# Patient Record
Sex: Male | Born: 1975 | Race: White | Hispanic: No | Marital: Married | Smoking: Never smoker
Health system: Southern US, Community
[De-identification: ages and names within clinical notes are randomized; demographics above are authoritative.]

## PROBLEM LIST (undated history)

## (undated) DIAGNOSIS — M545 Low back pain, unspecified: Secondary | ICD-10-CM

## (undated) DIAGNOSIS — M2550 Pain in unspecified joint: Secondary | ICD-10-CM

## (undated) DIAGNOSIS — R002 Palpitations: Secondary | ICD-10-CM

## (undated) DIAGNOSIS — R519 Headache, unspecified: Secondary | ICD-10-CM

## (undated) DIAGNOSIS — I10 Essential (primary) hypertension: Secondary | ICD-10-CM

## (undated) DIAGNOSIS — L309 Dermatitis, unspecified: Secondary | ICD-10-CM

## (undated) DIAGNOSIS — K219 Gastro-esophageal reflux disease without esophagitis: Secondary | ICD-10-CM

## (undated) DIAGNOSIS — R748 Abnormal levels of other serum enzymes: Secondary | ICD-10-CM

## (undated) HISTORY — DX: Dermatitis, unspecified: L30.9

## (undated) HISTORY — DX: Essential (primary) hypertension: I10

## (undated) HISTORY — DX: Abnormal levels of other serum enzymes: R74.8

## (undated) HISTORY — DX: Pain in unspecified joint: M25.50

## (undated) HISTORY — PX: OTHER SURGICAL HISTORY: SHX169

## (undated) HISTORY — DX: Palpitations: R00.2

## (undated) HISTORY — DX: Gastro-esophageal reflux disease without esophagitis: K21.9

## (undated) HISTORY — DX: Low back pain, unspecified: M54.50

## (undated) HISTORY — DX: Headache, unspecified: R51.9

---

## 2006-10-30 ENCOUNTER — Ambulatory Visit: Payer: Self-pay | Admitting: Internal Medicine

## 2006-10-30 DIAGNOSIS — L259 Unspecified contact dermatitis, unspecified cause: Secondary | ICD-10-CM

## 2006-10-30 DIAGNOSIS — J45909 Unspecified asthma, uncomplicated: Secondary | ICD-10-CM | POA: Insufficient documentation

## 2006-12-17 ENCOUNTER — Ambulatory Visit: Payer: Self-pay | Admitting: Internal Medicine

## 2006-12-18 ENCOUNTER — Encounter (INDEPENDENT_AMBULATORY_CARE_PROVIDER_SITE_OTHER): Payer: Self-pay | Admitting: *Deleted

## 2007-06-23 ENCOUNTER — Telehealth: Payer: Self-pay | Admitting: Internal Medicine

## 2007-06-25 ENCOUNTER — Encounter (INDEPENDENT_AMBULATORY_CARE_PROVIDER_SITE_OTHER): Payer: Self-pay | Admitting: *Deleted

## 2007-07-02 ENCOUNTER — Ambulatory Visit: Payer: Self-pay | Admitting: Internal Medicine

## 2007-07-02 LAB — CONVERTED CEMR LAB: Blood Glucose, Fasting: 89 mg/dL

## 2007-07-06 ENCOUNTER — Telehealth (INDEPENDENT_AMBULATORY_CARE_PROVIDER_SITE_OTHER): Payer: Self-pay | Admitting: *Deleted

## 2007-07-08 ENCOUNTER — Encounter: Payer: Self-pay | Admitting: Internal Medicine

## 2009-04-04 ENCOUNTER — Ambulatory Visit: Payer: Self-pay | Admitting: Internal Medicine

## 2009-06-03 DIAGNOSIS — R748 Abnormal levels of other serum enzymes: Secondary | ICD-10-CM

## 2009-06-03 HISTORY — DX: Abnormal levels of other serum enzymes: R74.8

## 2009-06-13 ENCOUNTER — Ambulatory Visit: Payer: Self-pay | Admitting: Internal Medicine

## 2009-06-13 LAB — CONVERTED CEMR LAB
ALT: 103 units/L — ABNORMAL HIGH (ref 0–53)
BUN: 14 mg/dL (ref 6–23)
Basophils Relative: 1 % (ref 0.0–3.0)
CO2: 29 meq/L (ref 19–32)
Calcium: 9.7 mg/dL (ref 8.4–10.5)
Creatinine, Ser: 1 mg/dL (ref 0.4–1.5)
Direct LDL: 144.6 mg/dL
GFR calc non Af Amer: 92.34 mL/min (ref >60)
Glucose, Bld: 93 mg/dL (ref 70–99)
HCT: 48.1 % (ref 39.0–52.0)
Hemoglobin: 16.9 g/dL (ref 13.0–17.0)
Lymphocytes Relative: 37.5 % (ref 12.0–46.0)
MCHC: 35.1 g/dL (ref 30.0–36.0)
MCV: 94.3 fL (ref 78.0–100.0)
Monocytes Absolute: 0.4 10*3/uL (ref 0.1–1.0)
Neutro Abs: 2.9 10*3/uL (ref 1.4–7.7)
Neutrophils Relative %: 51.2 % (ref 43.0–77.0)
Platelets: 271 10*3/uL (ref 150.0–400.0)
RDW: 13.4 % (ref 11.5–14.6)
Sodium: 141 meq/L (ref 135–145)
WBC: 5.8 10*3/uL (ref 4.5–10.5)

## 2009-06-15 ENCOUNTER — Telehealth: Payer: Self-pay | Admitting: Internal Medicine

## 2009-06-15 DIAGNOSIS — R7989 Other specified abnormal findings of blood chemistry: Secondary | ICD-10-CM | POA: Insufficient documentation

## 2009-06-26 ENCOUNTER — Ambulatory Visit: Payer: Self-pay | Admitting: Internal Medicine

## 2009-06-26 LAB — CONVERTED CEMR LAB
Albumin: 4.6 g/dL (ref 3.5–5.2)
Ferritin: 361.6 ng/mL — ABNORMAL HIGH (ref 22.0–322.0)
Total Bilirubin: 1 mg/dL (ref 0.3–1.2)
Total Protein: 7.2 g/dL (ref 6.0–8.3)

## 2009-07-03 LAB — CONVERTED CEMR LAB
HCV Ab: NEGATIVE
Hep B S Ab: NEGATIVE
Hepatitis B Surface Ag: NEGATIVE

## 2009-07-04 ENCOUNTER — Encounter: Payer: Self-pay | Admitting: Internal Medicine

## 2009-09-05 ENCOUNTER — Ambulatory Visit: Payer: Self-pay | Admitting: Internal Medicine

## 2009-09-05 DIAGNOSIS — R030 Elevated blood-pressure reading, without diagnosis of hypertension: Secondary | ICD-10-CM

## 2009-09-05 DIAGNOSIS — R002 Palpitations: Secondary | ICD-10-CM

## 2009-09-05 DIAGNOSIS — K219 Gastro-esophageal reflux disease without esophagitis: Secondary | ICD-10-CM | POA: Insufficient documentation

## 2009-09-05 DIAGNOSIS — L989 Disorder of the skin and subcutaneous tissue, unspecified: Secondary | ICD-10-CM | POA: Insufficient documentation

## 2009-09-10 ENCOUNTER — Telehealth: Payer: Self-pay | Admitting: Internal Medicine

## 2009-09-25 ENCOUNTER — Encounter: Payer: Self-pay | Admitting: Internal Medicine

## 2009-09-25 ENCOUNTER — Ambulatory Visit: Payer: Self-pay

## 2009-09-25 ENCOUNTER — Encounter (INDEPENDENT_AMBULATORY_CARE_PROVIDER_SITE_OTHER): Payer: Self-pay | Admitting: *Deleted

## 2009-09-25 ENCOUNTER — Ambulatory Visit: Payer: Self-pay | Admitting: Cardiology

## 2009-09-25 ENCOUNTER — Ambulatory Visit (HOSPITAL_COMMUNITY): Admission: RE | Admit: 2009-09-25 | Discharge: 2009-09-25 | Payer: Self-pay | Admitting: Internal Medicine

## 2010-01-08 ENCOUNTER — Ambulatory Visit: Payer: Self-pay | Admitting: Internal Medicine

## 2010-03-05 NOTE — Consult Note (Signed)
Summary: [facility]  [facility]   Imported By: Lanelle Bal 11/15/2009 14:21:17  _____________________________________________________________________  External Attachment:    Type:   Image     Comment:   External Document

## 2010-03-05 NOTE — Progress Notes (Signed)
Summary: heart beat  Phone Note Call from Patient   Caller: Patient Summary of Call: Pt called and states that he feels that his heart beat is skipping a beat. He said he discussed this with Dr. Drue Novel and Dr.Paz told him to call if he noticied the symptoms more often. He said they are happening randomly more at night. He is feeling very tired. Saturday night he felt dizzy but that was the only time that happened.  Please advise. Army Fossa CMA  September 10, 2009 11:02 AM   Follow-up for Phone Call        ER if symptoms severe Please schedule  -Holter monitor, 48 hours with diary -echocardiogram -office visit after tests Follow-up by: [facility] E. Paz MD,  September 10, 2009 1:30 PM  Additional Follow-up for Phone Call Additional follow up Details #1::        I spoke with pt he is aware to go to ER if symptoms severe. Put in referrals for echo and holter monitor. Army Fossa CMA  September 10, 2009 1:37 PM

## 2010-03-05 NOTE — Assessment & Plan Note (Signed)
Summary: CPX AND FASTING LABS///SPH   Vital Signs:  Patient profile:   35 year old male Height:      72.14 inches Weight:      279 pounds Pulse rate:   84 / minute BP sitting:   144 / 102  Vitals Entered By: Shary Decamp (Jun 13, 2009 8:05 AM) CC: cpx, fasting, concerned about wt   History of Present Illness: CPX concern about his weight   Preventive Screening-Counseling & Management  Caffeine-Diet-Exercise     Caffeine use/day: 2     Does Patient Exercise: no  Current Medications (verified): 1)  Ventolin Hfa 108 (90 Base) Mcg/act  Aers (Albuterol Sulfate) .... Prn 2)  Betamethasone Dipropionate 0.05 %  Crea (Betamethasone Dipropionate) .... Apply B.i.d. P.r.n. Two Hands  Allergies (verified): 1)  ! Pcn 2)  ! Jonne Ply  Past History:  Past Medical History: Reviewed history from 10/30/2006 and no changes required. Asthma  Past Surgical History: Reviewed history from 10/30/2006 and no changes required. bx of lump on inner thigh - dx scar tissue  Family History: Reviewed history from 10/30/2006 and no changes required. Father: living Mother: living   HTN-- M DM: MGF MI-- no colon ca-- no prostae ca--no     Social History: Reviewed history from 04/04/2009 and no changes required. 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 Resp:  Denies cough and wheezing; hardly ever has asthma symptoms , sometimes w/ cat exposure or a cold . GI:  Denies bloody stools, nausea, and vomiting. GU:  Denies dysuria, hematuria, and urinary hesitancy. Psych:  Denies anxiety and depression.  Physical Exam  General:  alert, well-developed, and overweight-appearing.   Neck:  no masses, no thyromegaly,  and normal carotid upstroke.   Lungs:  normal respiratory effort, no intercostal retractions, no accessory muscle use, and normal breath sounds.   Heart:  normal rate, regular rhythm, no murmur, and no gallop.   Abdomen:  soft, non-tender, no distention, and no masses.   Extremities:  no pretibial edema bilaterally  Psych:  Cognition and judgment appear intact. Alert and cooperative with normal attention span and concentration.  not anxious appearing and not depressed appearing.     Impression & Recommendations:  Problem # 1:  ROUTINE GENERAL MEDICAL EXAM@HEALTH  CARE FACL (ICD-V70.0) TD 2002 never had a Cscope  sees dentist routinely  patient is concerned about his weight and I agree with his concerns, BMI is approximately 38. He will not have time to exercise in the near future due to his long work hours he will have to concentrate in eating  healthier, portion control.  This was discussed with the patient, printed material provided. Declined  to see a nutritionist.   slight increased BP today, see instructions   Orders: Venipuncture (56387) TLB-BMP (Basic Metabolic Panel-BMET) (80048-METABOL) TLB-CBC Platelet - w/Differential (85025-CBCD) TLB-Lipid Panel (80061-LIPID) TLB-TSH (Thyroid Stimulating Hormone) (84443-TSH) TLB-ALT (SGPT) (84460-ALT) TLB-AST (SGOT) (84450-SGOT)  Problem # 2:  ECZEMA (ICD-692.9)  ongoing issue with hand eczema, status post dermatology evaluation, they couldn't help likes an  allergist referral---- Done His updated medication list for this problem includes:    Betamethasone Dipropionate 0.05 % Crea (Betamethasone dipropionate) .Marland Kitchen... Apply b.i.d.  p.r.n. two hands  Orders: Allergy Referral  (Allergy)  Complete Medication List: 1)  Ventolin Hfa 108 (90 Base) Mcg/act Aers (Albuterol sulfate) .... Prn 2)  Betamethasone Dipropionate 0.05 % Crea (Betamethasone dipropionate) .... Apply b.i.d. p.r.n. two hands  Patient Instructions: 1)  Check your blood  pressure 2 or 3 times a week. If it is more than 140/85 consistently,please let us know  2)  Please schedule a follow-up appointment in 1 year.    Preventive Care Screening  Prior Values:    Last Tetanus Booster:  Historical (02/04/2000)    Risk Factors:  Tobacco use:  never Passive smoke exposure:  no Drug use:  no Caffeine use:  2 drinks per day Alcohol use:  yes    Comments:  1x/wk Exercise:  no

## 2010-03-05 NOTE — Assessment & Plan Note (Signed)
Summary: 4 MONTH --PH   Vital Signs:  Patient profile:   35 year old male Height:      72 inches Weight:      290.13 pounds BMI:     39.49 Pulse rate:   82 / minute Pulse rhythm:   regular BP sitting:   134 / 81  (left arm) Cuff size:   large  Vitals Entered By: Army Fossa CMA (January 08, 2010 8:03 AM) CC: 4 month f/u- fasting  Comments [facility]    History of Present Illness: followup from the last office visit, she had palpitations Over all feels better, symptoms are less frequent and less intense Today he described a "palpitations" as a skipping heartbeat. There has been no associated symptoms lately He has not been able to tell if there is a trigger such as stress, alcohol, medicines.  Current Medications (verified): 1)  Ventolin Hfa 108 (90 Base) Mcg/act  Aers (Albuterol Sulfate) .... Prn 2)  Betamethasone Dipropionate 0.05 %  Crea (Betamethasone Dipropionate) .... Apply B.i.d. P.r.n. Two Hands  Allergies (verified): 1)  ! Pcn 2)  ! Jonne Ply  Past History:  Past Medical History: Reviewed history from 09/05/2009 and no changes required. Asthma increased LFTs 06/2009, negative hepatitis serology GERD Palpitations  Past Surgical History: Reviewed history from 10/30/2006 and no changes required. bx of lump on inner thigh - dx scar tissue  Review of Systems       no chest pain no amb BPs  Physical Exam  General:  alert and well-developed.   Lungs:  normal respiratory effort, no intercostal retractions, no accessory muscle use, and normal breath sounds.   Heart:  normal rate, regular rhythm, no murmur, and no gallop.     Impression & Recommendations:  Problem # 1:  PALPITATIONS (ICD-785.1) we discussed the results of the holter and echo. at the time he had the holter on him,  he had multiple episodes of palpitations ----> holter showed no arrythmias  at this point there is no evidence of any tachyarrhythmia. I recommend observation. Will call if  symptoms increase or are associated with red flags  Will consider cardiology referral and/or workup for pheochromocytoma.  Problem # 2:  ELEVATED BP READING WITHOUT DX HYPERTENSION (ICD-796.2) BP today is satisfactory. BP today: 134/81 Prior BP: 140/80 (09/05/2009)  Labs Reviewed: Creat: 1.0 (06/13/2009) Chol: 203 (06/13/2009)   HDL: 38.10 (06/13/2009)   TG: 124.0 (06/13/2009)     Complete Medication List: 1)  Ventolin Hfa 108 (90 Base) Mcg/act Aers (Albuterol sulfate) .... Prn 2)  Betamethasone Dipropionate 0.05 % Crea (Betamethasone dipropionate) .... Apply b.i.d. p.r.n. two hands  Patient Instructions: 1)  Please schedule a follow-up appointment in 6 months, fasting physical    Orders Added: 1)  Est. Patient Level III [16109]

## 2010-03-05 NOTE — Letter (Signed)
Summary: Outpatient Coinsurance Notice  Outpatient Coinsurance Notice   Imported By: Marylou Mccoy 10/05/2009 09:40:27  _____________________________________________________________________  External Attachment:    Type:   Image     Comment:   External Document

## 2010-03-05 NOTE — Progress Notes (Signed)
Summary: labs, abnormal LFTs    Phone Note Outgoing Call   Summary of Call: advise  patient, labs okay except for his LFTs that are mildly elevated  questions for the patient: History previous abnormal LFTs? How much alcohol or Tylenol does  he take? needs the following labs fasting: LFTs ( not  only AST ALT) anti HepBsAg anti HepBc HepBs Ag Hep C iron, ferritin Jacey Eckerson E. Nickolaus Bordelon MD  Jun 15, 2009 8:23 AM   Follow-up for Phone Call        discussed with pt Shary Decamp  Jun 15, 2009 11:04 AM   New Problems: OTHER ABNORMAL BLOOD CHEMISTRY (ICD-790.6)   New Problems: OTHER ABNORMAL BLOOD CHEMISTRY (ICD-790.6)  Appended Document: labs, abnormal LFTs   addendum: patient stated he does not drink but once a week or use tylenol

## 2010-03-05 NOTE — Assessment & Plan Note (Signed)
Summary: acid reflux-bp -eval mole/cbs   Vital Signs:  Patient profile:   35 year old male Weight:      284.25 pounds Pulse rate:   84 / minute Pulse rhythm:   regular BP sitting:   140 / 80  (left arm) Cuff size:   large  Vitals Entered By: Army Fossa CMA (September 05, 2009 2:42 PM) CC: Pt here to discuss issues. Comments - Discuss mole on temple. - Having Heartburn - Discuss BP   History of Present Illness: multiple issues has several skin lesions in the neck x years , like them removed Has a new skin lesion in the left temple  long history of GERD he describes as epigastric burning,some burping Symptoms increased lately Not taking any medication  feels like his heart skipped randomly, symptoms are mild and going on for many years. Last week the "skipping heartbeats" were more noticeable and lasted several hours  Ambulatory BPs in the 140s-150s/high 80s  ROS Denies nausea, vomiting, bloating stools No dysphagia or odynophagia Denies chest pain, shortness of breath or near fainting with skipping heartbeats No more stressed out than usual Caffeine intake at baseline  Current Medications (verified): 1)  Ventolin Hfa 108 (90 Base) Mcg/act  Aers (Albuterol Sulfate) .... Prn 2)  Betamethasone Dipropionate 0.05 %  Crea (Betamethasone Dipropionate) .... Apply B.i.d. P.r.n. Two Hands  Allergies: 1)  ! Pcn 2)  ! Asa  Past History:  Past Medical History: Asthma increased LFTs 06/2009, negative hepatitis serology GERD Palpitations  Social History: Reviewed history from 06/13/2009 and no changes required. 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   Lungs:  normal respiratory effort, no intercostal retractions, no accessory muscle use, and normal breath sounds.     Heart:  normal rate, regular rhythm, no murmur, and no gallop.   Abdomen:  soft, non-tender, no distention, no masses, no guarding, and no rigidity.   Skin:  several skin lesions consistent with skin tags in the back of the neck. A 3x2 mm lesion in the left temple, pink in color, nontender   Impression & Recommendations:  Problem # 1:  SKIN LESION (ICD-709.9)  patient desires excision of the back lesions The temple skin lesion is new   Dermatology referral  Orders: Dermatology Referral (Derma)  Problem # 2:  GERD (ICD-530.81)  recommend Prilosec over-the-counter Information provided regards GERD precautions  His updated medication list for this problem includes:    Prilosec Otc 20 Mg Tbec (Omeprazole magnesium) ..... One by mouth before breakfast  Problem # 3:  PALPITATIONS (ICD-785.1) see history of present illness No red  flag symptoms  EKG today shows sinus rhythm without ischemic changes, rate 69 plan: Observation, patient to call if chest pain, dizziness, diaphoresis.  Problem # 4:  OTHER ABNORMAL BLOOD CHEMISTRY (ICD-790.6) Will recheck LFTs on return to the office  Problem # 5:  ELEVATED BP READING WITHOUT DX HYPERTENSION (ICD-796.2) borderline elevation of BP in the ambulatory setting BP here today okay Will for now recommend lifestyle modification, low salt diet discussed with the patient  Orders: EKG w/ Interpretation (93000)  Complete Medication List: 1)  Ventolin Hfa 108 (90 Base) Mcg/act Aers (Albuterol sulfate) .... Prn 2)  Betamethasone Dipropionate 0.05 % Crea (Betamethasone dipropionate) .... Apply b.i.d. p.r.n. two hands 3)  Prilosec Otc 20 Mg  Tbec (Omeprazole magnesium) .... One by mouth before breakfast  Patient Instructions: 1)  Please schedule a follow-up appointment in 4 months .

## 2010-03-05 NOTE — Assessment & Plan Note (Signed)
Summary: ear infection/kdc   Vital Signs:  Patient profile:   35 year old male Height:      72.14 inches Weight:      284 pounds BMI:     38.51 Temp:     99.8 degrees F BP sitting:   120 / 80  Vitals Entered By: Shary Decamp (April 04, 2009 12:41 PM)  Nutrition Counseling: Patient's BMI is greater than 25 and therefore counseled on weight management options.  History of Present Illness: developed ear discomfort yesterday, left-sided  Allergies: 1)  ! Pcn 2)  ! Jonne Ply  Past History:  Past Medical History: Reviewed history from 10/30/2006 and no changes required. Asthma  Past Surgical History: Reviewed history from 10/30/2006 and no changes required. bx of lump on inner thigh - dx scar tissue  Social History: Single no kids  moved from [place] , 2008 tobacco-no  Review of Systems       denies fever at home, today at the office he has a low-grade temp no sinus congestion, no sore throat asthma is well controlled he use his gun during the weekend, he did use earplugs, denies tinnitus  Physical Exam  General:  alert and well-developed.   Head:  face symmetric, not tender to palpation in the maxillary sinuses Ears:  R ear normal and L ear normal.   Mouth:  no redness or discharge in the throat Lungs:  clear to auscultation bilaterally   Impression & Recommendations:  Problem # 1:  OTALGIA (ICD-388.70) otalgia a low-grade temp today, the exam is essentially normal early URI? Plan: Observation, he will call if he is no better in few days His updated medication list for this problem includes:    Doxy-caps 100 Mg Caps (Doxycycline hyclate) ..... One tab by mouth two times a day  Complete Medication List: 1)  Ventolin Hfa 108 (90 Base) Mcg/act Aers (Albuterol sulfate) .... Prn 2)  Betamethasone Dipropionate 0.05 % Crea (Betamethasone dipropionate) .... Apply b.i.d. p.r.n. two hands 3)  Doxy-caps 100 Mg Caps (Doxycycline hyclate) .... One tab by mouth two times a  day

## 2010-03-05 NOTE — Procedures (Signed)
Summary: summary report  summary report   Imported By: Mirna Mires 10/02/2009 11:21:52  _____________________________________________________________________  External Attachment:    Type:   Image     Comment:   External Document  Appended Document: summary report advise patient:  Holter essentially normal office visit if symptoms persist  Appended Document: summary report I spoke with pt and he is aware, he states that the symptoms come and go. He will come in if  they keep happening.

## 2010-07-09 ENCOUNTER — Encounter: Payer: Self-pay | Admitting: Internal Medicine

## 2010-07-10 ENCOUNTER — Encounter: Payer: Self-pay | Admitting: Internal Medicine

## 2010-07-10 ENCOUNTER — Ambulatory Visit (INDEPENDENT_AMBULATORY_CARE_PROVIDER_SITE_OTHER): Payer: 59 | Admitting: Internal Medicine

## 2010-07-10 DIAGNOSIS — R7989 Other specified abnormal findings of blood chemistry: Secondary | ICD-10-CM

## 2010-07-10 DIAGNOSIS — Z0000 Encounter for general adult medical examination without abnormal findings: Secondary | ICD-10-CM | POA: Insufficient documentation

## 2010-07-10 DIAGNOSIS — L259 Unspecified contact dermatitis, unspecified cause: Secondary | ICD-10-CM

## 2010-07-10 DIAGNOSIS — Z23 Encounter for immunization: Secondary | ICD-10-CM

## 2010-07-10 DIAGNOSIS — R002 Palpitations: Secondary | ICD-10-CM

## 2010-07-10 LAB — CBC WITH DIFFERENTIAL/PLATELET
Basophils Relative: 0.7 % (ref 0.0–3.0)
Eosinophils Relative: 2.7 % (ref 0.0–5.0)
Lymphocytes Relative: 35.5 % (ref 12.0–46.0)
MCV: 94.2 fl (ref 78.0–100.0)
Monocytes Absolute: 0.4 10*3/uL (ref 0.1–1.0)
Monocytes Relative: 6.5 % (ref 3.0–12.0)
Neutrophils Relative %: 54.6 % (ref 43.0–77.0)
Platelets: 246 10*3/uL (ref 150.0–400.0)
RBC: 4.99 Mil/uL (ref 4.22–5.81)
WBC: 6.6 10*3/uL (ref 4.5–10.5)

## 2010-07-10 LAB — BASIC METABOLIC PANEL
BUN: 11 mg/dL (ref 6–23)
Chloride: 104 mEq/L (ref 96–112)
GFR: 88.64 mL/min (ref >60.00)
Glucose, Bld: 86 mg/dL (ref 70–99)
Potassium: 4.1 mEq/L (ref 3.5–5.1)
Sodium: 139 mEq/L (ref 135–145)

## 2010-07-10 LAB — HEPATIC FUNCTION PANEL
ALT: 96 U/L — ABNORMAL HIGH (ref 0–53)
Alkaline Phosphatase: 77 U/L (ref 39–117)
Bilirubin, Direct: 0.3 mg/dL (ref 0.0–0.3)
Total Bilirubin: 2 mg/dL — ABNORMAL HIGH (ref 0.3–1.2)
Total Protein: 7.8 g/dL (ref 6.0–8.3)

## 2010-07-10 LAB — LIPID PANEL
HDL: 35.1 mg/dL — ABNORMAL LOW (ref >39.00)
LDL Cholesterol: 121 mg/dL — ABNORMAL HIGH (ref 0–99)
Total CHOL/HDL Ratio: 5
VLDL: 32.6 mg/dL (ref 0.0–40.0)

## 2010-07-10 MED ORDER — BETAMETHASONE DIPROPIONATE 0.05 % EX CREA: TOPICAL_CREAM | Freq: Two times a day (BID) | CUTANEOUS | Status: DC

## 2010-07-10 NOTE — Assessment & Plan Note (Addendum)
W/u 09-2009 negative Pt to call if if develops associated sx  Repeat EKG today

## 2010-07-10 NOTE — Assessment & Plan Note (Signed)
Td 2002 and 2012 Diet-exercise discussed Labs  STE

## 2010-07-10 NOTE — Progress Notes (Signed)
  Subjective:    Patient ID: Brendan [name], male    DOB: [birth year], 35 y.o.   MRN: [id number]  HPI CPX  Past Medical History  Diagnosis Date  . Asthma   . Increased liver enzymes 06/2009    negative hepatitis serology 5-11  . GERD (gastroesophageal reflux disease)   . Palpitations    Past Surgical History  Procedure Date  . 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 No blood in the stools. And we will control, essentially asymptomatic. He still has palpitations, about one episode every 2-3 months, similar features as before. He has figured out that sleep depravation is likely a trigger. To see dermatology soon in reference to moles       Objective:   Physical Exam  Constitutional: He is oriented to person, place, and time. He appears well-developed. No distress.  HENT:  Head: Normocephalic and atraumatic.  Neck: No thyromegaly present.  Cardiovascular: Normal rate, regular rhythm and normal heart sounds.   No murmur heard. Pulmonary/Chest: Effort normal and breath sounds normal. No respiratory distress. He has no wheezes. He has no rales.  Abdominal: Soft. Bowel sounds are normal. He exhibits no distension. There is no tenderness. There is no rebound and no guarding.  Musculoskeletal: He exhibits no edema.  Neurological: He is alert and oriented to person, place, and time.  Skin: Skin is warm and dry. He is not diaphoretic.  Psychiatric: He has a normal mood and affect. His behavior is normal. Judgment and thought content normal.            Assessment & Plan:

## 2010-07-10 NOTE — Assessment & Plan Note (Addendum)
Elevated LFTs x 2  Hepatitis serology nec 06-2009 Rarely drinks ETOH or uses tylenol Labs  If LFTs still elevated refer to GI

## 2010-07-10 NOTE — Assessment & Plan Note (Addendum)
RF bethametasone Will see derm for skin lesions, rec to ask about eczema although has consulted several derms before

## 2010-07-15 ENCOUNTER — Telehealth: Payer: Self-pay | Admitting: *Deleted

## 2010-07-15 DIAGNOSIS — D649 Anemia, unspecified: Secondary | ICD-10-CM

## 2010-07-15 NOTE — Telephone Encounter (Signed)
Message copied by Leanne Lovely on Mon Jul 15, 2010  2:43 PM ------      Message from: Willow Ora E      Created: Sun Jul 14, 2010  1:07 PM       Advise patient:      Cholesterol ok, TG slt elevated: wt loss a healthy diet and exercise more would help to get TG lower       LFTs cont to be elevated, iron was done as part of the w/u and is slt elevated : please arrange a GI referal, further w/u?

## 2010-07-15 NOTE — Telephone Encounter (Signed)
Message left for patient to return my call.  

## 2010-07-16 ENCOUNTER — Encounter: Payer: Self-pay | Admitting: Internal Medicine

## 2010-07-16 NOTE — Telephone Encounter (Signed)
Message left for patient to return my call.  

## 2010-07-16 NOTE — Telephone Encounter (Signed)
Pt is aware.  

## 2010-08-27 ENCOUNTER — Encounter: Payer: Self-pay | Admitting: Internal Medicine

## 2010-08-27 ENCOUNTER — Ambulatory Visit (INDEPENDENT_AMBULATORY_CARE_PROVIDER_SITE_OTHER): Payer: 59 | Admitting: Internal Medicine

## 2010-08-27 VITALS — BP 146/94 | HR 80 | Ht 72.0 in | Wt 279.2 lb

## 2010-08-27 DIAGNOSIS — R17 Unspecified jaundice: Secondary | ICD-10-CM

## 2010-08-27 DIAGNOSIS — E669 Obesity, unspecified: Secondary | ICD-10-CM

## 2010-08-27 DIAGNOSIS — R7989 Other specified abnormal findings of blood chemistry: Secondary | ICD-10-CM

## 2010-08-27 NOTE — Patient Instructions (Addendum)
Please try to increase physical activity and lose weight as discussed. Work toward your goal of weighing 230# over the next year.These will help your overall health and most likely help the abnormal lab tests improve. The working diagnosis of your abnormal liver tests and ferritin is that of fatty liver disease. Information is provided below. We will arrange for you to have liver tests and ferritin repeated in 3 months with Dr. Drue Novel as well as a recheck of your weight. Losing weight has no magical formula but consuming less calories is key. We can arrange a dietitian referral if you wish.   Calorie Counting Diet A calorie counting diet requires you to eat the number of calories that are right for you during a day. Calories are the measurement of how much energy you get from the food you eat. Eating the right amount of calories is important for staying at a healthy weight. If you eat too many calories your body will store them as fat and you may gain weight. If you eat too few calories you may lose weight. Counting the number of calories that you eat during a day will help you to know if you're eating the right amount. A Registered Dietitian can determine how many calories you need in a day. The amount of calories you need varies from person to person. If your goal is to lose weight you will need to eat fewer calories. Losing weight can benefit you if you are overweight or have health problems such as heart disease, high blood pressure or diabetes. If your goal is to gain weight, you will need to eat more calories. Gaining weight may be necessary if you have a certain health problem that causes your body to need more energy. TIPS Whether you are increasing or decreasing the number of calories you eat during a day, it may be hard to get used to changing what you eat and drink. The following are tips to help you keep track of the number of calories you are eating.  Measuring foods at home with measuring cups  will help you to know the actual amount of food and number of calories you are eating.   Restaurants serve food in all different portion sizes. It is common that restaurants will serve food in amounts worth 2 or more serving sizes. While eating out, it may be helpful to estimate how many servings of a food you are given. For example, a serving of cooked rice is 1/2 cup and that is the size of half of a fist. Knowing serving sizes will help you have a better idea of how much food you are eating at restaurants.   Ask for smaller portion sizes or child-size portions at restaurants.   Plan to eat half of a meal at a restaurant and take the rest home or share the other half with a friend   Read food labels for calorie content and serving size   Most packaged food has a Nutrition Facts Panel on its side or back. Here you can find out how many servings are in a package, the size of a serving, and the number of calories each serving has.   The serving size and number of servings per container are listed right below the Nutrition Facts heading. Just below the serving information, the number of calories in each serving is listed.   For example, say that a package has three cookies inside. The Nutrition Facts panel says that one serving is one  cookie. Below that, it says that there are three servings in the container. The calories section of the Nutrition Facts says there are 90 calories. That means that there are 90 calories in one cookie. If you eat one cookie you have eaten 90 calories. If you eat all three cookies, you have eaten three times that amount, or 270 calories.  The list below tells you how big or small some common portion sizes are.  1 ounce (oz).................4 stacked dice.   3 oz.............................Marland KitchenDeck of cards.   1 teaspoon (tsp)..........Marland KitchenTip of little finger.   1 tablespoon (Tbsp).Marland KitchenMarland KitchenMarland KitchenTip of thumb.   2 Tbsp.........................Marland KitchenGolf ball.     Cup.........................Marland KitchenHalf of a fist.   1 Cup..........................Marland KitchenA fist.  KEEP A FOOD LOG Write down every food item that you eat, how much of the food you eat, and the number of calories in each food that you eat during the day. At the end of the day or throughout the day you can add up the total number of calories you have eaten.  It may help to set up a list like the one below. Find out the calorie information by reading food labels.  Breakfast   Bran Flakes (1 cup, 110 calories).   Fat free milk ( cup, 45 calories).   Snack   Apple (1 medium, 80 calories).   Lunch   Spinach (1 cup, 20 calories).   Tomato ( medium, 20 calories).   Chicken breast strips (3 oz, 165 calories).   Shredded cheddar cheese ( cup, 110 calories).   Light [place] dressing (2 Tbsp, 60 calories).   Whole wheat bread (1 slice, 80 calories).   Tub margarine (1 tsp, 35 calories).   Vegetable soup (1 cup, 160 calories).   Dinner   Pork chop (3 oz, 190 calories).   Brown rice (1 cup, 215 calories).   Steamed broccoli ( cup, 20 calories).   Strawberries (1  cup, 65 calories).   Whipped cream (1 Tbsp, 50 calories).  Daily Calorie Total: 1425 Information from www.eatright.org, Foodwise Nutritional Analysis Database. Document Released: 01/20/2005 Document Re-Released: 02/11/2009 [facility] Patient Information 2011 [place], [place].    Obesity Obesity is defined as having a Body Mass Index (BMI) of 30 or more. To calculate your BMI divide your weight in pounds by your height in inches squared and multiply that product by 703. Major illnesses resulting from long-term obesity include:  Stroke.   Heart disease.   Diabetes.   Many cancers.   Arthritis.  Obesity also complicates recovery from many other medical problems.  CAUSES  A history of obesity in your parents.   Thyroid hormone imbalance.   Environmental factors such as excess calorie intake and physical  inactivity.  TREATMENT A healthy weight loss program includes:  A calorie restricted diet based on individual calorie needs.   Increased physical activity (exercise).  An exercise program is just as important as the right low calorie diet.  Weight-loss medicines should be used only under the supervision of your physician. These medicines help, but only if they are used with diet and exercise programs. Medicines can have side effects including nervousness, nausea, abdominal pain, diarrhea, headache, drowsiness, and depression.  An unhealthy weight loss program includes:  Fasting.   Fad diets.   Supplements and drugs.  These choices do not succeed in long-term weight control.  HOME CARE INSTRUCTIONS To help you make the needed dietary changes:   Keep a daily record of everything you eat. There are many free websites to  help you with this. It may be helpful to measure your foods so you can determine if you are eating the correct portion sizes.   Use low-calorie cookbooks or take special cooking classes.   Avoid alcohol. Drink more water and drinks with no calories.   Take vitamins and supplements only as recommended by your caregiver.   Weight-loss support groups, Optometrist, counselors, and stress reduction education can also be very helpful.  PREVENTION Losing weight and keeping it off takes time, discipline, a healthy diet and regular exercise. Document Released: 02/28/2004 Document Re-Released: 02/09/2007 [facility] Patient Information 2011 [employer], [place].  Fatty Liver Hepatosteatosis, Steatohepatitis Fatty liver is the accumulation of fat in liver cells. It is also called hepatosteatosis or steatohepatitis. It is normal for your liver to contain some fat. If fat is more than 5-10% of your liver's weight, you have fatty liver.  There are often no symptoms (problems) for years while damage is still occurring. People often learn about their fatty liver when they have  medical tests for other reasons. Fat can damage your liver for years or even decades without causing problems. When it becomes severe, it can cause fatigue, weight loss, weakness, and confusion. This makes you more likely to develop more serious liver problems. The liver is the largest organ in the body. It does a lot of work and often gives no warning signs when it is sick until late in a disease. The liver has many important jobs including:  Breaking down foods.   Storing vitamins, iron, and other minerals.   Making proteins.   Making bile for food digestion.   Breaking down many products including medications, alcohol and some poisons.  CAUSES There are a number of different conditions, medications, and poisons that can cause a fatty liver. Eating too many calories causes fat to build up in the liver. Not processing and breaking fats down normally may also cause this. Certain conditions, such as obesity, diabetes, and high triglycerides also cause this. Most fatty liver patients tend to be middle-aged and over weight.  Some causes of fatty liver are:  Alcohol over consumption.  Malnutrition.   Steroid use.   Valproic acid toxicity.   Obesity.  Cushing's syndrome.   Poisons.   Tetracycline in high dosages.   Pregnancy.  Diabetes.   Hyperlipidemia.   Rapid weight loss.   Some people develop fatty liver even having none of these conditions. SYMPTOMS Fatty liver most often causes no problems. This is called asymptomatic.  It can be diagnosed with blood tests and also by a liver biopsy.   It is one of the most common causes of minor elevations of liver enzymes on routine blood tests.   Specialized Imaging of the liver using ultrasound, CT (computed tomography) scan, or MRI (magnetic resonance imaging) can suggest a fatty liver but a biopsy is needed to confirm it.   A biopsy involves taking a small sample of liver tissue. This is done by using a needle. It is then looked  at under a microscope by a specialist.  TREATMENT  It is important to treat the cause. Simple fatty liver without a medical reason may not need treatment.  Weight loss, fat restriction, and exercise in overweight patients produces inconsistent results but is worth trying.   Fatty liver due to alcohol toxicity may not improve even with stopping drinking.   Good control of diabetes may reduce fatty liver.   Lower your triglycerides through diet, medication or both.   Eat  a balanced, healthy diet.   Increase your physical activity.   Get regular checkups from a liver specialist.   There are no medical or surgical treatments for a fatty liver or NASH, but improving your diet and increasing your exercise may help prevent or reverse some of the damage.  PROGNOSIS Fatty liver may cause no damage or it can lead to an inflammation of the liver. This is, called steatohepatitis. When it is linked to alcohol abuse, it is called alcoholic steatohepatitis. It often is not linked to alcohol. It is then called nonalcoholic steatohepatitis, or NASH. Over time the liver may become scarred and hardened. This condition is called cirrhosis. Cirrhosis is serious and may lead to liver failure or cancer. NASH is one of the leading causes of cirrhosis. About 10-20% of Americans have fatty liver and a smaller 2-5% has NASH. Much of this information is from the [employer]. Last reviewed by [facility] 03-06-05 Document Released: 03/07/2005 Document Re-Released: 04/18/2008 [facility] Patient Information 2011 [place], [place].

## 2010-08-27 NOTE — Progress Notes (Signed)
  Subjective:    Patient ID: Brendan [name], male    DOB: [birth year], 35 y.o.   MRN: [id number]  HPI This is a 35 year old white man who is sent to be evaluated for abnormal liver chemistries as well as an elevated ferritin. He has had abnormal transaminases in the 1-1/2-2 times range associated with a ferritin of 300 but less than 400 on 2 occasions, in 2011 and 2012. He had testing for hepatitis A, B, and C performed, and the has no evidence of having had these or immunity. He also has increased bilirubin, unconjugated. He reports that he has gained weight over the past several years because of working 60 hours a week, he is part of the [employer]. He is not exercising like he used to he is trying to ride his bike some, he says his girlfriend has fibromyalgia and that makes it difficult  to exercise together.  He has no history of blood transfusions or drug abuse, or other risk factors for liver disease including a family history.   Review of Systems As per history of present illness. There is some fatigue from the large amount of work. All other systems negative    Objective:   Physical Exam  Constitutional: He is oriented to person, place, and time. He appears well-developed and well-nourished.       Obese   HENT:  Head: Normocephalic.  Eyes: No scleral icterus.  Neck: Normal range of motion. Neck supple. No thyromegaly present.  Cardiovascular: Normal rate, regular rhythm and normal heart sounds.   Pulmonary/Chest: Effort normal and breath sounds normal.  Abdominal: Soft. Bowel sounds are normal. He exhibits no distension and no mass. There is no tenderness.       Obese, no HSM  Neurological: He is alert and oriented to person, place, and time.  Skin:       No stigmata of chronic liver disease  Psychiatric: He has a normal mood and affect.          Assessment & Plan:

## 2010-09-02 ENCOUNTER — Telehealth: Payer: Self-pay | Admitting: Internal Medicine

## 2010-09-02 DIAGNOSIS — R7989 Other specified abnormal findings of blood chemistry: Secondary | ICD-10-CM | POA: Insufficient documentation

## 2010-09-02 DIAGNOSIS — R17 Unspecified jaundice: Secondary | ICD-10-CM | POA: Insufficient documentation

## 2010-09-02 DIAGNOSIS — E669 Obesity, unspecified: Secondary | ICD-10-CM | POA: Insufficient documentation

## 2010-09-02 NOTE — Assessment & Plan Note (Signed)
Unconjugated - suggests Gilbert's. Checking ceruloplasmin reasonable and will arrange.

## 2010-09-02 NOTE — Assessment & Plan Note (Signed)
Transaminases 11/2-2 times abnormal over 1 year+. Overall scenario very suggestive of fatty liver - non-alcoholic. ? If just steatosis, or steatohepatitis, if so. He is not interested in further aggressive serologic work-up at this time. HAV, HBV, HCV studies negative. He will try for weight loss and reassess in 3 mos via PCP (closer office). Though unlikely will have him get a ceruloplasmin now given elevated bili. We discussed utility of liver bx and think not needed at this time as very unlikely to change treatment.

## 2010-09-02 NOTE — Assessment & Plan Note (Signed)
300 range does not suggest iron overload. More consistent with fatty liver and acute phase reactant.

## 2010-09-02 NOTE — Telephone Encounter (Signed)
Discussed w/ GI , advise patient: Came back within 1 week for a Ceruloplasmin--- Dx increased LFT Arrange for OV with me in  3 months, will needs LFTs rechecked and asses weight loss

## 2010-09-02 NOTE — Assessment & Plan Note (Signed)
Discussed need for eight loss - he is aware and will try to increase activity, eat less/differently and lose weight. I think likely has metabolic syndrome or is at least at great risk of that.

## 2010-09-03 NOTE — Telephone Encounter (Signed)
Pt states he is hesitant to get this labwork done, he states the GI doctor told him he just had a fatty liver probably. Please advise in detail why you feel he needs this labwork.

## 2010-09-06 NOTE — Telephone Encounter (Signed)
Ok, just ask him to RTC in 3 months, will discuss then

## 2010-09-06 NOTE — Telephone Encounter (Signed)
Pt is aware.  

## 2010-12-16 ENCOUNTER — Ambulatory Visit: Payer: 59 | Admitting: Internal Medicine

## 2010-12-16 DIAGNOSIS — Z0289 Encounter for other administrative examinations: Secondary | ICD-10-CM

## 2011-07-11 ENCOUNTER — Encounter: Payer: 59 | Admitting: Internal Medicine

## 2011-07-24 ENCOUNTER — Ambulatory Visit (INDEPENDENT_AMBULATORY_CARE_PROVIDER_SITE_OTHER): Payer: 59 | Admitting: Internal Medicine

## 2011-07-24 VITALS — BP 147/91 | HR 69 | Temp 98.4°F | Resp 16 | Ht 72.0 in | Wt 278.0 lb

## 2011-07-24 DIAGNOSIS — G4700 Insomnia, unspecified: Secondary | ICD-10-CM

## 2011-07-24 DIAGNOSIS — Z719 Counseling, unspecified: Secondary | ICD-10-CM

## 2011-07-24 MED ORDER — LORAZEPAM 2 MG PO TABS: 2.0000 mg | ORAL_TABLET | Freq: Every evening | ORAL | Status: AC

## 2011-07-24 MED ORDER — LORAZEPAM 2 MG PO TABS: 2.0000 mg | ORAL_TABLET | Freq: Every evening | ORAL | Status: DC

## 2011-07-24 NOTE — Patient Instructions (Signed)
Anxiety and Panic Attacks Your caregiver has informed you that you are having an anxiety or panic attack. There may be many forms of this. Most of the time these attacks come suddenly and without warning. They come at any time of day, including periods of sleep, and at any time of life. They may be strong and unexplained. Although panic attacks are very scary, they are physically harmless. Sometimes the cause of your anxiety is not known. Anxiety is a protective mechanism of the body in its fight or flight mechanism. Most of these perceived danger situations are actually nonphysical situations (such as anxiety over losing a job). CAUSES  The causes of an anxiety or panic attack are many. Panic attacks may occur in otherwise healthy people given a certain set of circumstances. There may be a genetic cause for panic attacks. Some medications may also have anxiety as a side effect. SYMPTOMS  Some of the most common feelings are:  Intense terror.   Dizziness, feeling faint.   Hot and cold flashes.   Fear of going crazy.   Feelings that nothing is real.   Sweating.   Shaking.   Chest pain or a fast heartbeat (palpitations).   Smothering, choking sensations.   Feelings of impending doom and that death is near.   Tingling of extremities, this may be from over-breathing.   Altered reality (derealization).   Being detached from yourself (depersonalization).  Several symptoms can be present to make up anxiety or panic attacks. DIAGNOSIS  The evaluation by your caregiver will depend on the type of symptoms you are experiencing. The diagnosis of anxiety or panic attack is made when no physical illness can be determined to be a cause of the symptoms. TREATMENT  Treatment to prevent anxiety and panic attacks may include:  Avoidance of circumstances that cause anxiety.   Reassurance and relaxation.   Regular exercise.   Relaxation therapies, such as yoga.   Psychotherapy with a  psychiatrist or therapist.   Avoidance of caffeine, alcohol and illegal drugs.   Prescribed medication.  SEEK IMMEDIATE MEDICAL CARE IF:   You experience panic attack symptoms that are different than your usual symptoms.   You have any worsening or concerning symptoms.  Document Released: 01/20/2005 Document Revised: 01/09/2011 Document Reviewed: 05/24/2009 [facility] Patient Information 2012 [place], [place].Insomnia Insomnia is frequent trouble falling and/or staying asleep. Insomnia can be a long term problem or a short term problem. Both are common. Insomnia can be a short term problem when the wakefulness is related to a certain stress or worry. Long term insomnia is often related to ongoing stress during waking hours and/or poor sleeping habits. Overtime, sleep deprivation itself can make the problem worse. Every little thing feels more severe because you are overtired and your ability to cope is decreased. CAUSES   Stress, anxiety, and depression.   Poor sleeping habits.   Distractions such as TV in the bedroom.   Naps close to bedtime.   Engaging in emotionally charged conversations before bed.   Technical reading before sleep.   Alcohol and other sedatives. They may make the problem worse. They can hurt normal sleep patterns and normal dream activity.   Stimulants such as caffeine for several hours prior to bedtime.   Pain syndromes and shortness of breath can cause insomnia.   Exercise late at night.   Changing time zones may cause sleeping problems (jet lag).  It is sometimes helpful to have someone observe your sleeping patterns. They should look for  periods of not breathing during the night (sleep apnea). They should also look to see how long those periods last. If you live alone or observers are uncertain, you can also be observed at a sleep clinic where your sleep patterns will be professionally monitored. Sleep apnea requires a checkup and treatment. Give your  caregivers your medical history. Give your caregivers observations your family has made about your sleep.  SYMPTOMS   Not feeling rested in the morning.   Anxiety and restlessness at bedtime.   Difficulty falling and staying asleep.  TREATMENT   Your caregiver may prescribe treatment for an underlying medical disorders. Your caregiver can give advice or help if you are using alcohol or other drugs for self-medication. Treatment of underlying problems will usually eliminate insomnia problems.   Medications can be prescribed for short time use. They are generally not recommended for lengthy use.   Over-the-counter sleep medicines are not recommended for lengthy use. They can be habit forming.   You can promote easier sleeping by making lifestyle changes such as:   Using relaxation techniques that help with breathing and reduce muscle tension.   Exercising earlier in the day.   Changing your diet and the time of your last meal. No night time snacks.   Establish a regular time to go to bed.   Counseling can help with stressful problems and worry.   Soothing music and white noise may be helpful if there are background noises you cannot remove.   Stop tedious detailed work at least one hour before bedtime.  HOME CARE INSTRUCTIONS   Keep a diary. Inform your caregiver about your progress. This includes any medication side effects. See your caregiver regularly. Take note of:   Times when you are asleep.   Times when you are awake during the night.   The quality of your sleep.   How you feel the next day.  This information will help your caregiver care for you.  Get out of bed if you are still awake after 15 minutes. Read or do some quiet activity. Keep the lights down. Wait until you feel sleepy and go back to bed.   Keep regular sleeping and waking hours. Avoid naps.   Exercise regularly.   Avoid distractions at bedtime. Distractions include watching television or engaging  in any intense or detailed activity like attempting to balance the household checkbook.   Develop a bedtime ritual. Keep a familiar routine of bathing, brushing your teeth, climbing into bed at the same time each night, listening to soothing music. Routines increase the success of falling to sleep faster.   Use relaxation techniques. This can be using breathing and muscle tension release routines. It can also include visualizing peaceful scenes. You can also help control troubling or intruding thoughts by keeping your mind occupied with boring or repetitive thoughts like the old concept of counting sheep. You can make it more creative like imagining planting one beautiful flower after another in your backyard garden.   During your day, work to eliminate stress. When this is not possible use some of the previous suggestions to help reduce the anxiety that accompanies stressful situations.  MAKE SURE YOU:   Understand these instructions.   Will watch your condition.   Will get help right away if you are not doing well or get worse.  Document Released: 01/18/2000 Document Revised: 01/09/2011 Document Reviewed: 02/17/2007 [facility] Patient Information 2012 [place], [place].

## 2011-07-24 NOTE — Progress Notes (Signed)
  Subjective:    Patient ID: Brendan [name], male    DOB: 11/09/1975, 36 y.o.   MRN: [id number]  HPI Is scheduling cpe with Dr. Albesa Seen. Requests help with insomnia.   Review of Systems See problem list-reviewed    Objective:   Physical Exam Normal       Assessment & Plan:  Trial lorazepam 2mg  hs Schedule cpe with Brendan [name]

## 2012-01-30 ENCOUNTER — Telehealth: Payer: Self-pay | Admitting: *Deleted

## 2012-01-30 MED ORDER — LORAZEPAM 2 MG PO TABS: 2.0000 mg | ORAL_TABLET | Freq: Every evening | ORAL | Status: DC | PRN

## 2012-01-30 NOTE — Telephone Encounter (Signed)
Pharmacy requesting refill on lorazepam 2mg . Last refill on 12/23/2011

## 2012-01-30 NOTE — Telephone Encounter (Signed)
Dr. Perrin Maltese is out of town currently. Ok to fill one time. Needs office visit for further refills. Rx printed and at the TL desk.

## 2013-07-05 ENCOUNTER — Encounter: Payer: Self-pay | Admitting: Internal Medicine

## 2013-12-12 ENCOUNTER — Telehealth: Payer: Self-pay | Admitting: Radiology

## 2013-12-12 NOTE — Telephone Encounter (Signed)
error 

## 2016-03-27 DIAGNOSIS — J4520 Mild intermittent asthma, uncomplicated: Secondary | ICD-10-CM | POA: Diagnosis not present

## 2016-03-27 DIAGNOSIS — I10 Essential (primary) hypertension: Secondary | ICD-10-CM | POA: Diagnosis not present

## 2016-03-27 DIAGNOSIS — J309 Allergic rhinitis, unspecified: Secondary | ICD-10-CM | POA: Diagnosis not present

## 2016-03-27 DIAGNOSIS — G479 Sleep disorder, unspecified: Secondary | ICD-10-CM | POA: Diagnosis not present

## 2016-03-27 DIAGNOSIS — E785 Hyperlipidemia, unspecified: Secondary | ICD-10-CM | POA: Diagnosis not present

## 2016-03-27 DIAGNOSIS — Z0001 Encounter for general adult medical examination with abnormal findings: Secondary | ICD-10-CM | POA: Diagnosis not present

## 2016-05-06 DIAGNOSIS — I10 Essential (primary) hypertension: Secondary | ICD-10-CM | POA: Diagnosis not present

## 2016-08-07 DIAGNOSIS — I10 Essential (primary) hypertension: Secondary | ICD-10-CM | POA: Diagnosis not present

## 2017-02-17 DIAGNOSIS — J069 Acute upper respiratory infection, unspecified: Secondary | ICD-10-CM | POA: Diagnosis not present

## 2017-02-17 DIAGNOSIS — J4521 Mild intermittent asthma with (acute) exacerbation: Secondary | ICD-10-CM | POA: Diagnosis not present

## 2017-07-16 ENCOUNTER — Encounter: Payer: Self-pay | Admitting: Physical Therapy

## 2017-07-16 ENCOUNTER — Ambulatory Visit: Payer: Commercial Managed Care - PPO | Attending: Family Medicine | Admitting: Physical Therapy

## 2017-07-16 ENCOUNTER — Other Ambulatory Visit: Payer: Self-pay

## 2017-07-16 DIAGNOSIS — M62838 Other muscle spasm: Secondary | ICD-10-CM | POA: Diagnosis present

## 2017-07-16 DIAGNOSIS — M25552 Pain in left hip: Secondary | ICD-10-CM | POA: Insufficient documentation

## 2017-07-17 NOTE — Therapy (Signed)
[facility] [address], [address] [place], [place], [address] Phone: [phone]   Fax:  [phone]  Physical Therapy Evaluation  Patient Details  Name: Brendan [name] MRN: [id number] Date of Birth: 07/03/1975 Referring Provider: Iva BoopKevin Via, MD   Encounter Date: 07/16/2017  PT End of Session - 07/17/17 0659    Visit Number  1    Number of Visits  12    Date for PT Re-Evaluation  08/27/17    Authorization Type  UHC    Authorization Time Period  07/16/17 to 08/27/17    Authorization - Visit Number  1    Authorization - Number of Visits  10    PT Start Time  1615    PT Stop Time  1700    PT Time Calculation (min)  45 min    Activity Tolerance  Patient tolerated treatment well;No increased pain    Behavior During Therapy  WFL for tasks assessed/performed       Past Medical History:  Diagnosis Date  . Asthma   . Eczema   . GERD (gastroesophageal reflux disease)   . Increased liver enzymes 06/2009   negative hepatitis serology 5-11  . Palpitations     Past Surgical History:  Procedure Laterality Date  . Biopsy of lump on inner thigh     dx scar tissue    There were no vitals filed for this visit.   Subjective Assessment - 07/16/17 1626    Subjective  Back in April, pt noted insidious onset of Lt glute pain. He also noted pain with sleeping on his Lt side. Pain has gotten worse and now extends to the lateral part of his Lt knee. He denies any numbness and tingling. He is hoping to leave on July 31st to go on a 3600 mile bike ride.     Limitations  Sitting    How long can you sit comfortably?  5-10 min     How long can you stand comfortably?  unlimited     How long can you walk comfortably?  unlimited     Patient Stated Goals  be able to go on his bike ride end of July     Currently in Pain?  No/denies    Pain Location  Buttocks    Pain Orientation  Left    Pain Descriptors / Indicators  Aching    Pain Type   Chronic pain    Pain Radiating Towards  Lt distal thigh     Pain Onset  More than a month ago    Pain Frequency  Intermittent    Aggravating Factors   sitting too long in his desk chair or on his motorcycle     Pain Relieving Factors  naproxen          [facility] PT Assessment - 07/17/17 0001      Assessment   Referring Provider  Iva BoopKevin Via, MD    Onset Date/Surgical Date  -- April 2019    Prior Therapy  none       Precautions   Precautions  None      Restrictions   Weight Bearing Restrictions  No      Balance Screen   Has the patient fallen in the past 6 months  No    Has the patient had a decrease in activity level because of a fear of falling?   No    Is the patient reluctant to leave their home because of a  fear of falling?   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, climbs the stairs up to the 3rd floor each morning     Leisure  motorcycle rides      Sensation   Light Touch  Appears Intact      Functional Tests   Functional tests  Other      Other:   Other/ Comments  Ascends and descends steps on his toes       ROM / Strength   AROM / PROM / Strength  Strength      AROM   Overall AROM Comments  AROM of the lumbar spine limited into extension and flexion 50%, but pain free       Strength   Overall Strength Comments  BLE grossly 5/5  MMT, (+) cramp noted Lt hamstring during testing       Flexibility   Soft Tissue Assessment /Muscle Length  yes    Hamstrings  20 deg lacking with 90/90 testing     Quadriceps  WNL hip flexor (+) thomas test Lt>Rt limitation    ITB  -- (+) ober's Lt      Palpation   Palpation comment  tenderness along Lt TFL, Lt glute med; palpable trigger points lateral quadriceps                Objective measurements completed on examination: See above  findings.      [facility] Adult PT Treatment/Exercise - 07/17/17 0001      Self-Care   Self-Care  Other Self-Care Comments    Other Self-Care Comments   self massage/rolling recommendations; dry needling info             PT Education - 07/17/17 0657    Education Details  eval findings POC; dry needling benefits     Person(s) Educated  Patient    Methods  Explanation    Comprehension  Verbalized understanding       PT Short Term Goals - 07/17/17 0700      PT SHORT TERM GOAL #1   Title  Pt will demo consistency and independence with his initial HEP to address ROM limitations and pain.     Time  3    Period  Weeks    Status  New    Target Date  08/06/17      PT SHORT TERM GOAL #2   Title  Pt will report atleast 30% reduction in pain from the start of therapy to allow him to plan for his long bike ride.     Time  3    Period  Weeks    Status  New      PT SHORT TERM GOAL #3   Title  Pt will report being able to sit for up to 30 minutes at work without increase in Lt buttock/thigh pain.     Time  3    Period  Weeks    Status  New        PT Long Term Goals - 07/17/17 0702      PT LONG TERM GOAL #1   Title  Pt will demo improved B hip extension ROM to atleast 5 deg which will assist with proper mechanics during standing and walking  activity.     Time  6    Period  Weeks    Status  New    Target Date  08/27/17      PT LONG TERM GOAL #2   Title  Pt will report being able to go for an hour long bike ride without increase in Lt buttock/hip pain.     Time  6    Period  Weeks    Status  New      PT LONG TERM GOAL #3   Title  Pt will be able to sit comfortably at work for atleast 50 minutes without the need for a standing break.     Time  6    Period  Weeks    Status  New      PT LONG TERM GOAL #4   Title  Pt will be able to sleep through the night without report of Lt hip pain, to improve quality of sleep.     Time  6    Period  Weeks    Status  New              Plan - 07/17/17 6962    Clinical Impression Statement  Pt is a pleasant 42 y.o M referred to OPPT with Lt hip/gluteal pain onset insidiously several months ago. Pain is increased with sitting no more than 5-10 minutes in his desk chair. He demonstrates limitations in lumbar and hip flexibility, however his strength is within normal limits. There is palpable muscle tenderness along the Lt glutes, TFL and distal ITB insertion. He is currently having issues with comfort while sleeping in addition to limited time on his motorcycle. He is planning to go on a week-long motorcycle trip in several weeks and would benefit from skilled PT intervention to improve his flexibility, mechanics and provide manual techniques to decrease muscle spasm and pain along the LLE.    Clinical Presentation  Evolving    Clinical Presentation due to:  gradually worsening pain     Clinical Decision Making  Low    Rehab Potential  Good    PT Frequency  2x / week    PT Duration  6 weeks    PT Treatment/Interventions  ADLs/Self Care Home Management;Electrical Stimulation;Cryotherapy;Moist Heat;Iontophoresis 4mg /ml Dexamethasone;Therapeutic exercise;Therapeutic activities;Neuromuscular re-education;Manual techniques;Dry needling;Passive range of motion;Patient/family education    PT Next Visit Plan  f/u on self massage with ball; possible DN to Lt glutes/lateral quad/TFL; hip flexibility exercise    PT Home Exercise Plan  next session    Consulted and Agree with Plan of Care  Patient       Patient will benefit from skilled therapeutic intervention in order to improve the following deficits and impairments:  Decreased activity tolerance, Impaired flexibility, Postural dysfunction, Improper body mechanics, Decreased range of motion, Pain, Increased muscle spasms, Decreased mobility, Hypomobility  Visit Diagnosis: Pain in left hip  Other muscle spasm     Problem List Patient Active Problem List   Diagnosis  Date Noted  . Insomnia 07/24/2011  . Elevated bilirubin 09/02/2010  . Elevated ferritin 09/02/2010  . Obesity (BMI 30-39.9) 09/02/2010  . Nonspecific elevation of levels of transaminase or lactic acid dehydrogenase (LDH) 09/02/2010  . GERD 09/05/2009  . Palpitations 09/05/2009  . ELEVATED BP READING WITHOUT DX HYPERTENSION 09/05/2009  . OTHER ABNORMAL BLOOD CHEMISTRY 06/15/2009  . ASTHMA 10/30/2006  . ECZEMA 10/30/2006    11:22 AM,07/17/17 Donita Brooks PT, DPT [facility]  [phone]  [facility] [address], [address] [place], [place], [address] Phone: [phone]   Fax:  [phone]  Name: Orpheus Hayhurst MRN: [id number] Date of Birth: 1975-12-09

## 2017-07-21 ENCOUNTER — Ambulatory Visit: Payer: Commercial Managed Care - PPO | Admitting: Physical Therapy

## 2017-07-21 DIAGNOSIS — M62838 Other muscle spasm: Secondary | ICD-10-CM

## 2017-07-21 DIAGNOSIS — M25552 Pain in left hip: Secondary | ICD-10-CM | POA: Diagnosis not present

## 2017-07-21 NOTE — Patient Instructions (Signed)
Access Code: [email or web address]  URL: [email or web address]  Date: 07/21/2017  Prepared by: Marylyn IshiharaSara Kiser   Exercises  Clamshell with Resistance - 15 reps - 3 sets - 1x daily - 7x weekly  Sidelying Thoracic Lumbar Rotation - 15 reps - 3 sets - 1x daily - 7x weekly  Seated Hip Internal Rotation AROM - 10 reps - 3 sets - 1x daily - 7x weekly  Thoracic Extension Mobilization on Foam Roll - 10 reps - 1 sets - 1x daily - 7x weekly

## 2017-07-22 NOTE — Therapy (Signed)
[facility] [address], [address] [place], [place], [address] Phone: [phone]   Fax:  [phone]  Physical Therapy Treatment  Patient Details  Name: Brendan [name] MRN: [id number] Date of Birth: [birth year] Referring Provider: Iva Boop, MD   Encounter Date: 07/21/2017  PT End of Session - 07/21/17 1550    Visit Number  2    Number of Visits  12    Date for PT Re-Evaluation  08/27/17    Authorization Type  UHC    Authorization Time Period  07/16/17 to 08/27/17    Authorization - Visit Number  2    Authorization - Number of Visits  10    PT Start Time  1531    PT Stop Time  1618 7 min ice    PT Time Calculation (min)  47 min    Activity Tolerance  Patient tolerated treatment well;No increased pain    Behavior During Therapy  WFL for tasks assessed/performed       Past Medical History:  Diagnosis Date  . Asthma   . Eczema   . GERD (gastroesophageal reflux disease)   . Increased liver enzymes 06/2009   negative hepatitis serology 5-11  . Palpitations     Past Surgical History:  Procedure Laterality Date  . Biopsy of lump on inner thigh     dx scar tissue    There were no vitals filed for this visit.  Subjective Assessment - 07/21/17 1537    Subjective  Pt reports that things are going great. He has been using the ball atleast once a day, and he has noticed a big difference in his pain. He was able to go for a bike ride for up to an hour without increase in his buttock and hip pain.     Limitations  Sitting    How long can you sit comfortably?  5-10 min     How long can you stand comfortably?  unlimited     How long can you walk comfortably?  unlimited     Patient Stated Goals  be able to go on his bike ride end of July     Currently in Pain?  No/denies    Pain Onset  More than a month ago                       [facility] Adult PT Treatment/Exercise - 07/22/17 0001      Exercises   Exercises   Knee/Hip;Other Exercises    Other Exercises   quadruped thoracic rotation, threading the needling x15 reps each      Knee/Hip Exercises: Standing   Other Standing Knee Exercises  standing lunge with IR/ER (overpressure) with LE in chair, 2x10 each       Knee/Hip Exercises: Seated   Other Seated Knee/Hip Exercises  Lt and Rt hip IR with knees pressing into bolster 2x15 reps      Knee/Hip Exercises: Supine   Other Supine Knee/Hip Exercises  hooklying low trunk rotation x15 reps each side       Knee/Hip Exercises: Sidelying   Clams  x15 reps with green TB each LE    Other Sidelying Knee/Hip Exercises  thoracic rotation Lt and Rt x15 reps each       Manual Therapy   Manual therapy comments  STM and trigger point release along the Lt gluteals, glute med distal insertion  PT Education - 07/21/17 1604    Education Details  Access Code: [email or web address]     Person(s) Educated  Patient    Methods  Explanation    Comprehension  Verbalized understanding       PT Short Term Goals - 07/22/17 0806      PT SHORT TERM GOAL #1   Title  Pt will demo consistency and independence with his initial HEP to address ROM limitations and pain.     Time  3    Period  Weeks    Status  Achieved      PT SHORT TERM GOAL #2   Title  Pt will report atleast 30% reduction in pain from the start of therapy to allow him to plan for his long bike ride.     Time  3    Period  Weeks    Status  On-going      PT SHORT TERM GOAL #3   Title  Pt will report being able to sit for up to 30 minutes at work without increase in Lt buttock/thigh pain.     Time  3    Period  Weeks    Status  Achieved        PT Long Term Goals - 07/17/17 5409      PT LONG TERM GOAL #1   Title  Pt will demo improved B hip extension ROM to atleast 5 deg which will assist with proper mechanics during standing and walking activity.     Time  6    Period  Weeks    Status  New    Target Date  08/27/17      PT LONG TERM  GOAL #2   Title  Pt will report being able to go for an hour long bike ride without increase in Lt buttock/hip pain.     Time  6    Period  Weeks    Status  New      PT LONG TERM GOAL #3   Title  Pt will be able to sit comfortably at work for atleast 50 minutes without the need for a standing break.     Time  6    Period  Weeks    Status  New      PT LONG TERM GOAL #4   Title  Pt will be able to sleep through the night without report of Lt hip pain, to improve quality of sleep.     Time  6    Period  Weeks    Status  New            Plan - 07/22/17 0803    Clinical Impression Statement  Pt arrived reporting improvements in his Lt hip/lateral thigh pain since the evaluation. He has been completing his HEP as instructed and is now able to ride his motorcycle up to an hour without pain down the lateral thigh. Session focused on therex to address spine and hip mobility restrictions into rotation primarily. Completed soft tissue mobilization along the Lt gluteals and pt noted no pain end of session.     Rehab Potential  Good    PT Frequency  2x / week    PT Duration  6 weeks    PT Treatment/Interventions  ADLs/Self Care Home Management;Electrical Stimulation;Cryotherapy;Moist Heat;Iontophoresis 4mg /ml Dexamethasone;Therapeutic exercise;Therapeutic activities;Neuromuscular re-education;Manual techniques;Dry needling;Passive range of motion;Patient/family education    PT Next Visit Plan  possible DN to Lt glutes/lateral quad/TFL if needed; hip flexibility exercises (  hip flexor and IR/ER mobility    PT Home Exercise Plan  Access Code: [email or web address]     Consulted and Agree with Plan of Care  Patient       Patient will benefit from skilled therapeutic intervention in order to improve the following deficits and impairments:  Decreased activity tolerance, Impaired flexibility, Postural dysfunction, Improper body mechanics, Decreased range of motion, Pain, Increased muscle spasms, Decreased  mobility, Hypomobility  Visit Diagnosis: Pain in left hip  Other muscle spasm     Problem List Patient Active Problem List   Diagnosis Date Noted  . Insomnia 07/24/2011  . Elevated bilirubin 09/02/2010  . Elevated ferritin 09/02/2010  . Obesity (BMI 30-39.9) 09/02/2010  . Nonspecific elevation of levels of transaminase or lactic acid dehydrogenase (LDH) 09/02/2010  . GERD 09/05/2009  . Palpitations 09/05/2009  . ELEVATED BP READING WITHOUT DX HYPERTENSION 09/05/2009  . OTHER ABNORMAL BLOOD CHEMISTRY 06/15/2009  . ASTHMA 10/30/2006  . ECZEMA 10/30/2006    8:07 AM,07/22/17 Donita BrooksSara Lyniah Fujita PT, DPT [facility]  [phone]  [facility] [address], [address] [place], [place], [address] Phone: [phone]   Fax:  [phone]  Name: Brendan [name] MRN: [id number] Date of Birth: 09/13/1975

## 2017-07-24 ENCOUNTER — Ambulatory Visit: Payer: Commercial Managed Care - PPO | Admitting: Physical Therapy

## 2017-07-24 ENCOUNTER — Encounter: Payer: Self-pay | Admitting: Physical Therapy

## 2017-07-24 DIAGNOSIS — M62838 Other muscle spasm: Secondary | ICD-10-CM

## 2017-07-24 DIAGNOSIS — M25552 Pain in left hip: Secondary | ICD-10-CM | POA: Diagnosis not present

## 2017-07-24 NOTE — Therapy (Signed)
[facility] [address] [place], [place], [address] Phone: [phone]   Fax:  [phone]  Physical Therapy Treatment  Patient Details  Name: Brendan [name] MRN: [id number] Date of Birth: 01/11/1976 Referring Provider: Iva Boop, MD   Encounter Date: 07/24/2017  PT End of Session - 07/24/17 1207    Visit Number  3    Number of Visits  12    Date for PT Re-Evaluation  08/27/17    Authorization Type  UHC 12 visits    Authorization Time Period  07/16/17 to 08/27/17    PT Start Time  1015    PT Stop Time  1110 dry needling, MH    PT Time Calculation (min)  55 min    Activity Tolerance  Patient tolerated treatment well       Past Medical History:  Diagnosis Date  . Asthma   . Eczema   . GERD (gastroesophageal reflux disease)   . Increased liver enzymes 06/2009   negative hepatitis serology 5-11  . Palpitations     Past Surgical History:  Procedure Laterality Date  . Biopsy of lump on inner thigh     dx scar tissue    There were no vitals filed for this visit.  Subjective Assessment - 07/24/17 1017    Subjective  I could really feel it that night in the side of the knee and left buttock after last time.  I feel pretty good today.  I felt that spot after 1 hour on motorcycle but not all the way down to the knee.   Sitting at work aggravates me.   I've changed to a kneeling chair and that has helped.      Currently in Pain?  Yes    Pain Score  1     Pain Location  Hip    Pain Orientation  Left    Pain Type  Chronic pain                       [facility] Adult PT Treatment/Exercise - 07/24/17 0001      Knee/Hip Exercises: Stretches   ITB Stretch  Left;3 reps;20 seconds    Piriformis Stretch  Left;3 reps;30 seconds    Other Knee/Hip Stretches  review of previous HEP      Moist Heat Therapy   Number Minutes Moist Heat  10 Minutes    Moist Heat Location  Hip      Manual Therapy   Joint  Mobilization  left long leg axis distraction, inferior, lateral distraction, AP in internal rotation grade 3/4 3x 20 sec each    Soft tissue mobilization  gluteals, piriformis, lateral quads, lateral HS    Muscle Energy Technique  piriformis contract-relax 3x 5 sec hold        Trigger Point Dry Needling - 07/24/17 1205    Consent Given?  Yes    Education Handout Provided  Yes    Muscles Treated Lower Body  Gluteus minimus;Gluteus maximus;Piriformis;Tensor fascia lata;Quadriceps;Hamstring    Gluteus Maximus Response  Palpable increased muscle length    Gluteus Minimus Response  Palpable increased muscle length    Piriformis Response  Palpable increased muscle length    Tensor Fascia Lata Response  Palpable increased muscle length    Quadriceps Response  Palpable increased muscle length    Hamstring Response  Palpable increased muscle length           PT Education - 07/24/17 1131  Education Details  dry needling after care;  standing ITB stretch;  supine piriformis crossed knee stretch.      Person(s) Educated  Patient    Methods  Explanation;Demonstration;Handout    Comprehension  Verbalized understanding;Returned demonstration       PT Short Term Goals - 07/22/17 0806      PT SHORT TERM GOAL #1   Title  Pt will demo consistency and independence with his initial HEP to address ROM limitations and pain.     Time  3    Period  Weeks    Status  Achieved      PT SHORT TERM GOAL #2   Title  Pt will report atleast 30% reduction in pain from the start of therapy to allow him to plan for his long bike ride.     Time  3    Period  Weeks    Status  On-going      PT SHORT TERM GOAL #3   Title  Pt will report being able to sit for up to 30 minutes at work without increase in Lt buttock/thigh pain.     Time  3    Period  Weeks    Status  Achieved        PT Long Term Goals - 07/17/17 9604      PT LONG TERM GOAL #1   Title  Pt will demo improved B hip extension ROM to  atleast 5 deg which will assist with proper mechanics during standing and walking activity.     Time  6    Period  Weeks    Status  New    Target Date  08/27/17      PT LONG TERM GOAL #2   Title  Pt will report being able to go for an hour long bike ride without increase in Lt buttock/hip pain.     Time  6    Period  Weeks    Status  New      PT LONG TERM GOAL #3   Title  Pt will be able to sit comfortably at work for atleast 50 minutes without the need for a standing break.     Time  6    Period  Weeks    Status  New      PT LONG TERM GOAL #4   Title  Pt will be able to sleep through the night without report of Lt hip pain, to improve quality of sleep.     Time  6    Period  Weeks    Status  New            Plan - 07/24/17 1055    Clinical Impression Statement  The patient has improved soft tissue and joint and posterior capsular mobility following dry needling and manual therapy.    Added supine FABER piriformis ex and standing ITB stretch to HEP.  Encouraged frequent change of position at work to avoid prolonged sitting.      Rehab Potential  Good    PT Frequency  2x / week    PT Duration  6 weeks    PT Treatment/Interventions  ADLs/Self Care Home Management;Electrical Stimulation;Cryotherapy;Moist Heat;Iontophoresis 4mg /ml Dexamethasone;Therapeutic exercise;Therapeutic activities;Neuromuscular re-education;Manual techniques;Dry needling;Passive range of motion;Patient/family education    PT Next Visit Plan  assess response to  DN #1 to Lt glutes/lateral quad/TFL; hip flexibility exercises (hip flexor and IR/ER mobility    PT Home Exercise Plan  Access Code: [email or web address]  Patient will benefit from skilled therapeutic intervention in order to improve the following deficits and impairments:  Decreased activity tolerance, Impaired flexibility, Postural dysfunction, Improper body mechanics, Decreased range of motion, Pain, Increased muscle spasms, Decreased mobility,  Hypomobility  Visit Diagnosis: Pain in left hip  Other muscle spasm     Problem List Patient Active Problem List   Diagnosis Date Noted  . Insomnia 07/24/2011  . Elevated bilirubin 09/02/2010  . Elevated ferritin 09/02/2010  . Obesity (BMI 30-39.9) 09/02/2010  . Nonspecific elevation of levels of transaminase or lactic acid dehydrogenase (LDH) 09/02/2010  . GERD 09/05/2009  . Palpitations 09/05/2009  . ELEVATED BP READING WITHOUT DX HYPERTENSION 09/05/2009  . OTHER ABNORMAL BLOOD CHEMISTRY 06/15/2009  . ASTHMA 10/30/2006  . ECZEMA 10/30/2006   Lavinia SharpsStacy Simpson, PT 07/24/17 12:15 PM Phone: [phone] Fax: [phone]  Vivien PrestoSimpson, Stacy C 07/24/2017, 12:15 PM  [facility] [address], [address] [place], [place], [address] Phone: [phone]   Fax:  [phone]  Name: Brendan [name] MRN: [id number] Date of Birth: 01/20/1976

## 2017-07-24 NOTE — Patient Instructions (Addendum)
   Access Code: [email or web address]  URL: [email or web address]  Date: 07/24/2017  Prepared by: Lavinia SharpsStacy Simpson   Exercises  Standing ITB Stretch - 5 reps - 1 sets - 20 hold - 1x daily - 7x weekly  Doorway Piriformis Stretch - 5 reps - 1 sets - 20 hold - 1x daily - 7x weekly        Trigger Point Dry Needling  . What is Trigger Point Dry Needling (DN)? o DN is a physical therapy technique used to treat muscle pain and dysfunction. Specifically, DN helps deactivate muscle trigger points (muscle knots).  o A thin filiform needle is used to penetrate the skin and stimulate the underlying trigger point. The goal is for a local twitch response (LTR) to occur and for the trigger point to relax. No medication of any kind is injected during the procedure.   . What Does Trigger Point Dry Needling Feel Like?  o The procedure feels different for each individual patient. Some patients report that they do not actually feel the needle enter the skin and overall the process is not painful. Very mild bleeding may occur. However, many patients feel a deep cramping in the muscle in which the needle was inserted. This is the local twitch response.   Marland Kitchen. How Will I feel after the treatment? o Soreness is normal, and the onset of soreness may not occur for a few hours. Typically this soreness does not last longer than two days.  o Bruising is uncommon, however; ice can be used to decrease any possible bruising.  o In rare cases feeling tired or nauseous after the treatment is normal. In addition, your symptoms may get worse before they get better, this period will typically not last longer than 24 hours.   . What Can I do After My Treatment? o Increase your hydration by drinking more water for the next 24 hours. o You may place ice or heat on the areas treated that have become sore, however, do not use heat on inflamed or bruised areas. Heat often brings more relief post needling. o You can continue your  regular activities, but vigorous activity is not recommended initially after the treatment for 24 hours. o DN is best combined with other physical therapy such as strengthening, stretching, and other therapies.     Lavinia SharpsStacy Simpson PT [facility] [address], [address] [place], [place] [address] Phone # [phone] Fax [phone]

## 2017-07-28 ENCOUNTER — Ambulatory Visit: Payer: Commercial Managed Care - PPO | Admitting: Physical Therapy

## 2017-07-28 DIAGNOSIS — M25552 Pain in left hip: Secondary | ICD-10-CM

## 2017-07-28 DIAGNOSIS — M62838 Other muscle spasm: Secondary | ICD-10-CM

## 2017-07-28 NOTE — Therapy (Signed)
[facility] [address], [address] [place], [place], [address] Phone: [phone]   Fax:  [phone]  Physical Therapy Treatment  Patient Details  Name: Brendan [name] MRN: [id number] Date of Birth: 09/09/1975 Referring Provider: Iva BoopKevin Via, MD   Encounter Date: 07/28/2017  PT End of Session - 07/28/17 1614    Visit Number  4    Number of Visits  12    Date for PT Re-Evaluation  08/27/17    Authorization Type  [employer] 12 visits    Authorization Time Period  07/16/17 to 08/27/17    PT Start Time  1530    PT Stop Time  1610    PT Time Calculation (min)  40 min    Activity Tolerance  Patient tolerated treatment well;No increased pain    Behavior During Therapy  WFL for tasks assessed/performed       Past Medical History:  Diagnosis Date  . Asthma   . Eczema   . GERD (gastroesophageal reflux disease)   . Increased liver enzymes 06/2009   negative hepatitis serology 5-11  . Palpitations     Past Surgical History:  Procedure Laterality Date  . Biopsy of lump on inner thigh     dx scar tissue    There were no vitals filed for this visit.  Subjective Assessment - 07/28/17 1537    Subjective  Pt states that he "felt weird" for a while after the needling last session. He completed his HEP last night and noticed his hip felt aggravated following. He does not have pain on the outside of the knee, but does note tenderness to touch over the greater trochanter.     Currently in Pain?  No/denies                       [facility] - 07/28/17 0001      Knee/Hip Exercises: Stretches   Other Knee/Hip Stretches  eccentric hip flexor lengthening in supine, 3# ankle weight x15 reps each     Other Knee/Hip Stretches  hip adductor stretch slide on knees x10 reps each       Knee/Hip Exercises: Seated   Other Seated Knee/Hip Exercises  hip IR/ER transition with UE support       Knee/Hip Exercises:  Supine   Other Supine Knee/Hip Exercises  Lt hip flexor MWM into flexion with belt pull inferiorly 2x10 reps       Knee/Hip Exercises: Sidelying   Other Sidelying Knee/Hip Exercises  side plank clam with green TB 4x5 reps each              PT Education - 07/28/17 1613    Education Details  implications for exercises performed; technique with therex and updated HEP    Person(s) Educated  Patient    Methods  Explanation;Handout;Verbal cues;Demonstration    Comprehension  Verbalized understanding;Returned demonstration       PT Short Term Goals - 07/28/17 1550      PT SHORT TERM GOAL #1   Title  Pt will demo consistency and independence with his initial HEP to address ROM limitations and pain.     Time  3    Period  Weeks    Status  Achieved      PT SHORT TERM GOAL #2   Title  Pt will report atleast 30% reduction in pain from the start of therapy to allow him to plan for his long bike ride.  Baseline  60-70% improvement     Time  3    Period  Weeks    Status  Achieved      PT SHORT TERM GOAL #3   Title  Pt will report being able to sit for up to 30 minutes at work without increase in Lt buttock/thigh pain.     Baseline  up to an hour    Time  3    Period  Weeks    Status  Achieved        PT Long Term Goals - 07/17/17 1610      PT LONG TERM GOAL #1   Title  Pt will demo improved B hip extension ROM to atleast 5 deg which will assist with proper mechanics during standing and walking activity.     Time  6    Period  Weeks    Status  New    Target Date  08/27/17      PT LONG TERM GOAL #2   Title  Pt will report being able to go for an hour long bike ride without increase in Lt buttock/hip pain.     Time  6    Period  Weeks    Status  New      PT LONG TERM GOAL #3   Title  Pt will be able to sit comfortably at work for atleast 50 minutes without the need for a standing break.     Time  6    Period  Weeks    Status  New      PT LONG TERM GOAL #4   Title   Pt will be able to sleep through the night without report of Lt hip pain, to improve quality of sleep.     Time  6    Period  Weeks    Status  New            Plan - 07/28/17 1620    Clinical Impression Statement  Pt is making steady progress towards his goals, meeting all short term goals since beginning PT. He feels overall 60-70% improved. Focused on therex to improve hip mobility and strength, introducing higher level exercises. Pt had no issue with today's exercises and reported no pain during or following the activity. Will continue with current POC.     Rehab Potential  Good    PT Frequency  2x / week    PT Duration  6 weeks    PT Treatment/Interventions  ADLs/Self Care Home Management;Electrical Stimulation;Cryotherapy;Moist Heat;Iontophoresis 4mg /ml Dexamethasone;Therapeutic exercise;Therapeutic activities;Neuromuscular re-education;Manual techniques;Dry needling;Passive range of motion;Patient/family education    PT Next Visit Plan  f/u on self massage to hip; hip flexibility exercises (hip flexor and IR/ER mobility); hamstring work (deadlift, etc.)    PT Home Exercise Plan  Access Code: [email or web address]     Consulted and Agree with Plan of Care  Patient       Patient will benefit from skilled therapeutic intervention in order to improve the following deficits and impairments:  Decreased activity tolerance, Impaired flexibility, Postural dysfunction, Improper body mechanics, Decreased range of motion, Pain, Increased muscle spasms, Decreased mobility, Hypomobility  Visit Diagnosis: Pain in left hip  Other muscle spasm     Problem List Patient Active Problem List   Diagnosis Date Noted  . Insomnia 07/24/2011  . Elevated bilirubin 09/02/2010  . Elevated ferritin 09/02/2010  . Obesity (BMI 30-39.9) 09/02/2010  . Nonspecific elevation of [name] of transaminase or lactic acid dehydrogenase (LDH) 09/02/2010  .  GERD 09/05/2009  . Palpitations 09/05/2009  . ELEVATED BP READING  WITHOUT DX HYPERTENSION 09/05/2009  . OTHER ABNORMAL BLOOD CHEMISTRY 06/15/2009  . ASTHMA 10/30/2006  . ECZEMA 10/30/2006   4:28 PM,07/28/17 Donita Brooks PT, DPT [facility]  [phone]  [facility] [address] [place], [place], [address] Phone: [phone]   Fax:  [phone]  Name: Brendan [name] MRN: [id number] Date of Birth: 1975-11-12

## 2017-07-28 NOTE — Patient Instructions (Signed)
Access Code: [email or web address]  URL: [email or web address]  Date: 07/28/2017  Prepared by: Marylyn IshiharaSara Kiser   Exercises  Sidelying Thoracic Lumbar Rotation - 15 reps - 3 sets - 1x daily - 7x weekly  Seated Hip Internal Rotation AROM - 10 reps - 3 sets - 1x daily - 7x weekly  Thoracic Extension Mobilization on Foam Roll - 10 reps - 1 sets - 1x daily - 7x weekly  Side Plank with Clam and Resistance - 10 reps - 2 sets - 1x daily - 7x weekly    [facility] [address], [address] [place], [place] [address] Phone # [phone] Fax [phone]

## 2017-07-31 ENCOUNTER — Ambulatory Visit: Payer: Commercial Managed Care - PPO | Admitting: Physical Therapy

## 2017-07-31 ENCOUNTER — Encounter: Payer: Self-pay | Admitting: Physical Therapy

## 2017-07-31 DIAGNOSIS — M25552 Pain in left hip: Secondary | ICD-10-CM | POA: Diagnosis not present

## 2017-07-31 DIAGNOSIS — M62838 Other muscle spasm: Secondary | ICD-10-CM

## 2017-07-31 NOTE — Therapy (Signed)
[facility] [address] [place], [place], [address] Phone: [phone]   Fax:  [phone]  Physical Therapy Treatment  Patient Details  Name: Brendan [name] MRN: [id number] Date of Birth: [birth year] Referring Provider: Iva Boop, MD   Encounter Date: 07/31/2017  PT End of Session - 07/31/17 1156    Visit Number  5    Number of Visits  12    Date for PT Re-Evaluation  08/27/17    Authorization Type  [employer] 12 visits    Authorization Time Period  07/16/17 to 08/27/17    PT Start Time  1015    PT Stop Time  1057    PT Time Calculation (min)  42 min    Activity Tolerance  Patient tolerated treatment well       Past Medical History:  Diagnosis Date  . Asthma   . Eczema   . GERD (gastroesophageal reflux disease)   . Increased liver enzymes 06/2009   negative hepatitis serology 5-11  . Palpitations     Past Surgical History:  Procedure Laterality Date  . Biopsy of lump on inner thigh     dx scar tissue    There were no vitals filed for this visit.  Subjective Assessment - 07/31/17 1022    Subjective  Plans on riding his motorcycle about 400 miles on his bike this weekend.   My pain has improved.   Exercises aggravate but I think it's mostly just soreness rather than the usual pain.        Patient Stated Goals  be able to go on his bike ride end of July     Currently in Pain?  Yes    Pain Score  1  mostly just quad soreness    Pain Location  Hip    Pain Orientation  Left    Pain Type  Chronic pain                       [facility] Adult PT Treatment/Exercise - 07/31/17 0001      Knee/Hip Exercises: Stretches   Piriformis Stretch  Right;Left;5 reps;20 seconds figure 4 supine    Other Knee/Hip Stretches  standing with leg on table with forward bend 5x right/left     Other Knee/Hip Stretches  supine fig 4 pull into internal rotation and adduction 5x each side      Knee/Hip Exercises: Standing   Gait Training  foot prop on table with elbow to opp knee 10x each side    Other Standing Knee Exercises  SLS deadlift while holding blue band 10x bil     Other Standing Knee Exercises  high stepping with SKTC stretch 10x       Knee/Hip Exercises: Prone   Other Prone Exercises  quadruped legs crossed rocking 10x    Other Prone Exercises  quadruped rocking with leg abducted 10x each side      Manual Therapy   Joint Mobilization  left   long leg axis distraction, inferior, lateral distraction, AP in internal rotation grade 3/4 3x 20 sec each; prone PA, PA in internal rotation and PA in external rotation 3x20 sec grade 4               PT Short Term Goals - 07/28/17 1550      PT SHORT TERM GOAL #1   Title  Pt will demo consistency and independence with his initial HEP to address ROM limitations and pain.  Time  3    Period  Weeks    Status  Achieved      PT SHORT TERM GOAL #2   Title  Pt will report atleast 30% reduction in pain from the start of therapy to allow him to plan for his long bike ride.     Baseline  60-70% improvement     Time  3    Period  Weeks    Status  Achieved      PT SHORT TERM GOAL #3   Title  Pt will report being able to sit for up to 30 minutes at work without increase in Lt buttock/thigh pain.     Baseline  up to an hour    Time  3    Period  Weeks    Status  Achieved        PT Long Term Goals - 07/17/17 4540      PT LONG TERM GOAL #1   Title  Pt will demo improved B hip extension ROM to atleast 5 deg which will assist with proper mechanics during standing and walking activity.     Time  6    Period  Weeks    Status  New    Target Date  08/27/17      PT LONG TERM GOAL #2   Title  Pt will report being able to go for an hour long bike ride without increase in Lt buttock/hip pain.     Time  6    Period  Weeks    Status  New      PT LONG TERM GOAL #3   Title  Pt will be able to sit comfortably at work for atleast 50 minutes without the  need for a standing break.     Time  6    Period  Weeks    Status  New      PT LONG TERM GOAL #4   Title  Pt will be able to sleep through the night without report of Lt hip pain, to improve quality of sleep.     Time  6    Period  Weeks    Status  New            Plan - 07/31/17 1156    Clinical Impression Statement  The patient is able to perform moderate intensity mobility exercises with focus on improving hip internal and external rotation ROM.  He does not report any pain in lateral hip region or lateral knee.  His discomfort is mostly described as "soreness" in his quad muscles from "working out" on Tuesday and Wednesday.  He plans to try riding his bike this weekend to help determine readiness for his longer trip planned at the end of July.      Rehab Potential  Good    PT Frequency  2x / week    PT Duration  6 weeks    PT Treatment/Interventions  ADLs/Self Care Home Management;Electrical Stimulation;Cryotherapy;Moist Heat;Iontophoresis 4mg /ml Dexamethasone;Therapeutic exercise;Therapeutic activities;Neuromuscular re-education;Manual techniques;Dry needling;Passive range of motion;Patient/family education    PT Next Visit Plan  f/u on self massage to hip; hip flexibility exercises (hip flexor and IR/ER mobility); hamstring work (deadlift, etc.);  see how motorcycle ride went on Sunday    PT Home Exercise Plan  Access Code: [email or web address]        Patient will benefit from skilled therapeutic intervention in order to improve the following deficits and impairments:  Decreased activity tolerance, Impaired flexibility, Postural  dysfunction, Improper body mechanics, Decreased range of motion, Pain, Increased muscle spasms, Decreased mobility, Hypomobility  Visit Diagnosis: Pain in left hip  Other muscle spasm     Problem List Patient Active Problem List   Diagnosis Date Noted  . Insomnia 07/24/2011  . Elevated bilirubin 09/02/2010  . Elevated ferritin 09/02/2010  . Obesity (BMI  30-39.9) 09/02/2010  . Nonspecific elevation of levels of transaminase or lactic acid dehydrogenase (LDH) 09/02/2010  . GERD 09/05/2009  . Palpitations 09/05/2009  . ELEVATED BP READING WITHOUT DX HYPERTENSION 09/05/2009  . OTHER ABNORMAL BLOOD CHEMISTRY 06/15/2009  . ASTHMA 10/30/2006  . ECZEMA 10/30/2006   Lavinia SharpsStacy Simpson, PT 07/31/17 12:04 PM Phone: [phone] Fax: [phone]  Vivien PrestoSimpson, Stacy C 07/31/2017, 12:04 PM  [facility] [address] [place], [place], [address] Phone: [phone]   Fax:  [phone]  Name: Brendan [name] MRN: [id number] Date of Birth: 11/09/1975

## 2017-08-04 ENCOUNTER — Encounter: Payer: Self-pay | Admitting: Physical Therapy

## 2017-08-04 ENCOUNTER — Ambulatory Visit: Payer: Commercial Managed Care - PPO | Attending: Family Medicine | Admitting: Physical Therapy

## 2017-08-04 DIAGNOSIS — M25552 Pain in left hip: Secondary | ICD-10-CM | POA: Diagnosis not present

## 2017-08-04 DIAGNOSIS — M62838 Other muscle spasm: Secondary | ICD-10-CM | POA: Insufficient documentation

## 2017-08-04 NOTE — Therapy (Signed)
[facility] [address] [place], [place], [address] Phone: [phone]   Fax:  [phone]  Physical Therapy Treatment  Patient Details  Name: Brendan Sleightlexander Leiter MRN: [id number] Date of Birth: 10/13/1975 Referring Provider: Iva BoopKevin Via, MD   Encounter Date: 08/04/2017  PT End of Session - 08/04/17 1608    Visit Number  6    Number of Visits  12    Date for PT Re-Evaluation  08/27/17    Authorization Type  [employer] 12 visits    Authorization Time Period  07/16/17 to 08/27/17    PT Start Time  1532    PT Stop Time  1612    PT Time Calculation (min)  40 min    Activity Tolerance  Patient tolerated treatment well;No increased pain    Behavior During Therapy  WFL for tasks assessed/performed       Past Medical History:  Diagnosis Date  . Asthma   . Eczema   . GERD (gastroesophageal reflux disease)   . Increased liver enzymes 06/2009   negative hepatitis serology 5-11  . Palpitations     Past Surgical History:  Procedure Laterality Date  . Biopsy of lump on inner thigh     dx scar tissue    There were no vitals filed for this visit.  Subjective Assessment - 08/04/17 1532    Subjective  Pt reports that on Saturday he mowed his lawn and completed some other yard work, pulling weeds. This started to irritate the lateral aspect of his thigh. He woke up on Sunday with some irritation and went on a motorcycle ride, and noticed gluteal pain when he was about 40 miles in.     Patient Stated Goals  be able to go on his bike ride end of July     Currently in Pain?  No/denies "can feel something" but no pain                        [facility] Adult PT Treatment/Exercise - 08/04/17 0001      Self-Care   Self-Care  Other Self-Care Comments    Other Self-Care Comments   rolling and adjustments to routine prior to bike rides       Exercises   Other Exercises   tripod hold with 5# weight pass Lt/Rt x20 reps; tripod  hold with trunk rotation holding orange weighted ball x10 reps       Knee/Hip Exercises: Standing   Other Standing Knee Exercises  single leg lunge with hip IR/ER x10 reps each       Knee/Hip Exercises: Sidelying   Other Sidelying Knee/Hip Exercises  foam rolling to Lt ITB x20 reps; Foam rolling Lt gluteal       Knee/Hip Exercises: Prone   Other Prone Exercises  quadruped cat/camel x20 reps; quadruped firehydrants x10 reps each       Manual Therapy   Soft tissue mobilization  trigger point release Lt  piriformis, TPR Lt glute med, rolling stick along Lt lateral quadriceps              PT Education - 08/04/17 1611    Education Details  importance of completing foam rolling at home; technique with therex    Person(s) Educated  Patient    Methods  Explanation;Verbal cues;Tactile cues    Comprehension  Verbalized understanding;Returned demonstration       PT Short Term Goals - 07/28/17 1550  PT SHORT TERM GOAL #1   Title  Pt will demo consistency and independence with his initial HEP to address ROM limitations and pain.     Time  3    Period  Weeks    Status  Achieved      PT SHORT TERM GOAL #2   Title  Pt will report atleast 30% reduction in pain from the start of therapy to allow him to plan for his long bike ride.     Baseline  60-70% improvement     Time  3    Period  Weeks    Status  Achieved      PT SHORT TERM GOAL #3   Title  Pt will report being able to sit for up to 30 minutes at work without increase in Lt buttock/thigh pain.     Baseline  up to an hour    Time  3    Period  Weeks    Status  Achieved        PT Long Term Goals - 07/17/17 1610      PT LONG TERM GOAL #1   Title  Pt will demo improved B hip extension ROM to atleast 5 deg which will assist with proper mechanics during standing and walking activity.     Time  6    Period  Weeks    Status  New    Target Date  08/27/17      PT LONG TERM GOAL #2   Title  Pt will report being able to  go for an hour long bike ride without increase in Lt buttock/hip pain.     Time  6    Period  Weeks    Status  New      PT LONG TERM GOAL #3   Title  Pt will be able to sit comfortably at work for atleast 50 minutes without the need for a standing break.     Time  6    Period  Weeks    Status  New      PT LONG TERM GOAL #4   Title  Pt will be able to sleep through the night without report of Lt hip pain, to improve quality of sleep.     Time  6    Period  Weeks    Status  New            Plan - 08/04/17 1618    Clinical Impression Statement  Pt arrived today reporting pain with his bike ride over the weekend. He was able to go 40 miles without increase in his Lt gluteal pain. Upon arrival he noted some discomfort, but did not provide a pain rating. Completed manual treatment to the Lt hip and pelvic region, with palpable trigger points in the piriformis and lateral quadriceps. Pt reported no pain or noticeable discomfort end of today's session. Therapist provided ways for pt to improve hip mobility and flexibility prior to his next ride and pt verbalized agreement with this.    Rehab Potential  Good    PT Frequency  2x / week    PT Duration  6 weeks    PT Treatment/Interventions  ADLs/Self Care Home Management;Electrical Stimulation;Cryotherapy;Moist Heat;Iontophoresis 4mg /ml Dexamethasone;Therapeutic exercise;Therapeutic activities;Neuromuscular re-education;Manual techniques;Dry needling;Passive range of motion;Patient/family education    PT Next Visit Plan  f/u on bike ride; hip flexibility exercises (hip flexor and IR/ER mobility); hamstring work (deadlift, etc.);  see how motorcycle ride went on Sunday  PT Home Exercise Plan  Access Code: [email or web address]        Patient will benefit from skilled therapeutic intervention in order to improve the following deficits and impairments:  Decreased activity tolerance, Impaired flexibility, Postural dysfunction, Improper body mechanics,  Decreased range of motion, Pain, Increased muscle spasms, Decreased mobility, Hypomobility  Visit Diagnosis: Pain in left hip  Other muscle spasm     Problem List Patient Active Problem List   Diagnosis Date Noted  . Insomnia 07/24/2011  . Elevated bilirubin 09/02/2010  . Elevated ferritin 09/02/2010  . Obesity (BMI 30-39.9) 09/02/2010  . Nonspecific elevation of levels of transaminase or lactic acid dehydrogenase (LDH) 09/02/2010  . GERD 09/05/2009  . Palpitations 09/05/2009  . ELEVATED BP READING WITHOUT DX HYPERTENSION 09/05/2009  . OTHER ABNORMAL BLOOD CHEMISTRY 06/15/2009  . ASTHMA 10/30/2006  . ECZEMA 10/30/2006    4:25 PM,08/04/17 Donita Brooks PT, DPT [facility]  [phone]  [facility] [address] [place], [place], [address] Phone: [phone]   Fax:  [phone]  Name: Anwar Sakata MRN: [id number] Date of Birth: May 27, 1975

## 2017-08-11 ENCOUNTER — Ambulatory Visit: Payer: Commercial Managed Care - PPO | Admitting: Physical Therapy

## 2017-08-11 DIAGNOSIS — M62838 Other muscle spasm: Secondary | ICD-10-CM

## 2017-08-11 DIAGNOSIS — M25552 Pain in left hip: Secondary | ICD-10-CM

## 2017-08-12 NOTE — Therapy (Signed)
[facility] [address], [address] [place], [place], [address] Phone: [phone]   Fax:  [phone]  Physical Therapy Treatment  Patient Details  Name: Brendan [name] MRN: [id number] Date of Birth: March 08, 1975 Referring Provider: Iva Boop, MD   Encounter Date: 08/11/2017  PT End of Session - 08/11/17 1549    Visit Number  7    Number of Visits  12    Date for PT Re-Evaluation  08/27/17    Authorization Type  [employer] 12 visits    Authorization Time Period  07/16/17 to 08/27/17    PT Start Time  1530    PT Stop Time  1615    PT Time Calculation (min)  45 min    Activity Tolerance  Patient tolerated treatment well;No increased pain    Behavior During Therapy  WFL for tasks assessed/performed       Past Medical History:  Diagnosis Date  . Asthma   . Eczema   . GERD (gastroesophageal reflux disease)   . Increased liver enzymes 06/2009   negative hepatitis serology 5-11  . Palpitations     Past Surgical History:  Procedure Laterality Date  . Biopsy of lump on inner thigh     dx scar tissue    There were no vitals filed for this visit.  Subjective Assessment - 08/11/17 1535    Subjective  Pt reports that he feels "something" in the left hip/buttock region, but not pain. He was able to ride for about 1 hour this weekend but noted the pain in his buttock region still.     Patient Stated Goals  be able to go on his bike ride end of July     Currently in Pain?  No/denies                       [facility] Adult PT Treatment/Exercise - 08/12/17 0001      Knee/Hip Exercises: Stretches   Other Knee/Hip Stretches  pigeon stretch 4x20 sec each      Knee/Hip Exercises: Standing   Other Standing Knee Exercises  hip abduction with green TB around knees x10 reps each     Other Standing Knee Exercises  BLE deadlift with 10# weights x10 reps, heavy cuing to decrease lumbar flexion      Knee/Hip Exercises: Seated   Other Seated Knee/Hip Exercises  lat stretch over foam roll with end range thoracic extension 10x5 sec hold       Knee/Hip Exercises: Supine   Other Supine Knee/Hip Exercises  thoracic extension over foam roll 3x10 reps arms crossed over chest     Other Supine Knee/Hip Exercises  self rolling over gluteals      Knee/Hip Exercises: Prone   Other Prone Exercises  child's pose stretch middle, Lt and Rt x30 sec             PT Education - 08/12/17 0754    Education Details  ways to incorporate stretching during the day    Person(s) Educated  Patient    Methods  Explanation    Comprehension  Verbalized understanding       PT Short Term Goals - 07/28/17 1550      PT SHORT TERM GOAL #1   Title  Pt will demo consistency and independence with his initial HEP to address ROM limitations and pain.     Time  3    Period  Weeks    Status  Achieved  PT SHORT TERM GOAL #2   Title  Pt will report atleast 30% reduction in pain from the start of therapy to allow him to plan for his long bike ride.     Baseline  60-70% improvement     Time  3    Period  Weeks    Status  Achieved      PT SHORT TERM GOAL #3   Title  Pt will report being able to sit for up to 30 minutes at work without increase in Lt buttock/thigh pain.     Baseline  up to an hour    Time  3    Period  Weeks    Status  Achieved        PT Long Term Goals - 07/17/17 [id number]      PT LONG TERM GOAL #1   Title  Pt will demo improved B hip extension ROM to atleast 5 deg which will assist with proper mechanics during standing and walking activity.     Time  6    Period  Weeks    Status  New    Target Date  08/27/17      PT LONG TERM GOAL #2   Title  Pt will report being able to go for an hour long bike ride without increase in Lt buttock/hip pain.     Time  6    Period  Weeks    Status  New      PT LONG TERM GOAL #3   Title  Pt will be able to sit comfortably at work for atleast 50 minutes without the need for a  standing break.     Time  6    Period  Weeks    Status  New      PT LONG TERM GOAL #4   Title  Pt will be able to sleep through the night without report of Lt hip pain, to improve quality of sleep.     Time  6    Period  Weeks    Status  New            Plan - 08/12/17 0747    Clinical Impression Statement  Pt reports being able to ride for up to 1 hour without pain this past week. This still limits his ability to ride for 4 hour stretches, but this is a significant improvement from his initial presentation. Pt denies pain, but has difficulty reporting any other symptoms he feels that he is having. Completed exercises to improve latissimus flexibility and lumbar/ thoracic mobility which can improve pelvic position during sitting, and pt denied any pain end of session. Therapist encouraged full HEP adherence, specifically to target sof tissue mobility in the hip/gluteal region and he verbalized understanding and agreement with this.     Rehab Potential  Good    PT Frequency  2x / week    PT Duration  6 weeks    PT Treatment/Interventions  ADLs/Self Care Home Management;Electrical Stimulation;Cryotherapy;Moist Heat;Iontophoresis 4mg /ml Dexamethasone;Therapeutic exercise;Therapeutic activities;Neuromuscular re-education;Manual techniques;Dry needling;Passive range of motion;Patient/family education    PT Next Visit Plan  f/u on soft tissue mobilization; hip flexibility exercises (hip flexor and IR/ER mobility); hamstring work (deadlift, etc.)    PT Home Exercise Plan  Access Code: [email or web address]        Patient will benefit from skilled therapeutic intervention in order to improve the following deficits and impairments:  Decreased activity tolerance, Impaired flexibility, Postural dysfunction, Improper body mechanics, Decreased range  of motion, Pain, Increased muscle spasms, Decreased mobility, Hypomobility  Visit Diagnosis: Pain in left hip  Other muscle spasm     Problem List Patient  Active Problem List   Diagnosis Date Noted  . Insomnia 07/24/2011  . Elevated bilirubin 09/02/2010  . Elevated ferritin 09/02/2010  . Obesity (BMI 30-39.9) 09/02/2010  . Nonspecific elevation of levels of transaminase or lactic acid dehydrogenase (LDH) 09/02/2010  . GERD 09/05/2009  . Palpitations 09/05/2009  . ELEVATED BP READING WITHOUT DX HYPERTENSION 09/05/2009  . OTHER ABNORMAL BLOOD CHEMISTRY 06/15/2009  . ASTHMA 10/30/2006  . ECZEMA 10/30/2006    7:55 AM,08/12/17 Donita Brooks PT, DPT [facility]  [phone]  [facility] [address], [address] [place], [place], [address] Phone: [phone]   Fax:  [phone]  Name: Brendan [name] MRN: [id number] Date of Birth: 1975-08-29

## 2017-08-14 ENCOUNTER — Ambulatory Visit: Payer: Commercial Managed Care - PPO | Admitting: Physical Therapy

## 2017-08-18 ENCOUNTER — Ambulatory Visit: Payer: Commercial Managed Care - PPO | Admitting: Physical Therapy

## 2017-08-18 DIAGNOSIS — M25552 Pain in left hip: Secondary | ICD-10-CM

## 2017-08-18 DIAGNOSIS — M62838 Other muscle spasm: Secondary | ICD-10-CM

## 2017-08-18 NOTE — Therapy (Signed)
[facility] [address] [place], [place], [address] Phone: [phone]   Fax:  [phone]  Physical Therapy Treatment/Discharge  Patient Details  Name: Brendan [name] MRN: [id number] Date of Birth: 1975/05/06 Referring Provider: Dineen Kid, MD   Encounter Date: 08/18/2017  PT End of Session - 08/18/17 1538    Visit Number  8    Number of Visits  12    Date for PT Re-Evaluation  08/27/17    Authorization Type  [employer] 12 visits    Authorization Time Period  07/16/17 to 08/27/17    PT Start Time  1446    PT Stop Time  1535    PT Time Calculation (min)  49 min    Activity Tolerance  Patient tolerated treatment well;No increased pain    Behavior During Therapy  WFL for tasks assessed/performed       Past Medical History:  Diagnosis Date  . Asthma   . Eczema   . GERD (gastroesophageal reflux disease)   . Increased liver enzymes 06/2009   negative hepatitis serology 5-11  . Palpitations     Past Surgical History:  Procedure Laterality Date  . Biopsy of lump on inner thigh     dx scar tissue    There were no vitals filed for this visit.  Subjective Assessment - 08/18/17 1448    Subjective  Pt reports that things are about the same since last week. He has not been able to pinpoint causes of his hip sensation, but notices that he has not had anymore pain     Patient Stated Goals  be able to go on his bike ride end of July          [facility] PT Assessment - 08/18/17 0001      Assessment   Referring Provider  Dineen Kid, MD      Precautions   Precautions  None      Restrictions   Weight Bearing Restrictions  No      Balance Screen   Has the patient fallen in the past 6 months  No    Has the patient had a decrease in activity level because of a fear of falling?   No    Is the patient reluctant to leave their home because of a fear of falling?   No      Strength   Overall Strength Comments  BLE still 5/5  MMT                   [facility] Adult PT Treatment/Exercise - 08/18/17 0001      Exercises   Other Exercises   quadruped firehydrant 2x10 reps each LE with green TB       Knee/Hip Exercises: Stretches   Active Hamstring Stretch  Both;10 seconds;Limitations    Active Hamstring Stretch Limitations  x10 reps each LE with BUE pressdown    Quad Stretch  Left;1 rep;10 seconds;Limitations    Sports administrator Limitations  standing, HEP demo     Other Knee/Hip Stretches  pigeon stretch 3x30 sec     Other Knee/Hip Stretches  piriformis stretch seated 2x30 sec       Knee/Hip Exercises: Aerobic   Elliptical  L2 x3 min forward/backward       Knee/Hip Exercises: Standing   Hip Extension  Both;2 sets;10 reps    Extension Limitations  green TB    Other Standing Knee Exercises  standing firehydrants with green TB around knee  2x10 reps each              PT Education - 08/18/17 1552    Education Details  goals met; improvements since beginning PT; importance of continuing to work on HEP moving forward; updated HEP    Person(s) Educated  Patient    Methods  Explanation;Verbal cues;Handout    Comprehension  Verbalized understanding;Returned demonstration       PT Short Term Goals - 08/18/17 1545      PT SHORT TERM GOAL #1   Title  Pt will demo consistency and independence with his initial HEP to address ROM limitations and pain.     Time  3    Period  Weeks    Status  Achieved      PT SHORT TERM GOAL #2   Title  Pt will report atleast 30% reduction in pain from the start of therapy to allow him to plan for his long bike ride.     Baseline  60-70% improvement     Time  3    Period  Weeks    Status  Achieved      PT SHORT TERM GOAL #3   Title  Pt will report being able to sit for up to 30 minutes at work without increase in Lt buttock/thigh pain.     Baseline  up to an hour    Time  3    Period  Weeks    Status  Achieved        PT Long Term Goals - 08/18/17 1456       PT LONG TERM GOAL #1   Title  Pt will demo improved B hip extension ROM to atleast 5 deg which will assist with proper mechanics during standing and walking activity.     Time  6    Period  Weeks    Status  New      PT LONG TERM GOAL #2   Title  Pt will report being able to go for an hour long bike ride without increase in Lt buttock/hip pain.     Baseline  up to an hour on the bike no problem    Time  6    Period  Weeks    Status  Achieved      PT LONG TERM GOAL #3   Title  Pt will be able to sit comfortably at work for atleast 50 minutes without the need for a standing break.     Baseline  2.5 hours     Time  6    Period  Weeks    Status  Achieved      PT LONG TERM GOAL #4   Title  Pt will be able to sleep through the night without report of Lt hip pain, to improve quality of sleep.     Baseline  typically no problem with the hip    Time  6    Period  Weeks    Status  Achieved            Plan - 08/18/17 1538    Clinical Impression Statement  Pt was discharged this visit, having met all of his short and long term goals and reporting atleast 70% in his pain and activity tolerance since beginning PT. He demonstrates improved core stability, full BLE strength and increase in B hip extension ROM to atleast 5 deg. He reports resolution of Lt lateral knee pain, and he is able to sit for  up to 2.5 hours at work without noticing any pain or discomfort in the Lt buttock region. Pt has met all of his short and long term goals, although he feels he is not 100% improved, therapist discussed how it is atypical for our bodies to be comfortable in one position for hours at a time and discussed the benefits of continuing to complete his HEP moving forward. Pt was agreeable with this and has been independent with massage techniques and his HEP which will continue to improve his strength, endurance and flexibility. Completed therex to include in his HEP and he demonstrated good understanding of  this.     Rehab Potential  Good    PT Frequency  2x / week    PT Duration  6 weeks    PT Treatment/Interventions  ADLs/Self Care Home Management;Electrical Stimulation;Cryotherapy;Moist Heat;Iontophoresis 38m/ml Dexamethasone;Therapeutic exercise;Therapeutic activities;Neuromuscular re-education;Manual techniques;Dry needling;Passive range of motion;Patient/family education    PT Next Visit Plan  d/c with HEP    PT Home Exercise Plan  Access Code: [email or web address]    Consulted and Agree with Plan of Care  Patient       Patient will benefit from skilled therapeutic intervention in order to improve the following deficits and impairments:  Decreased activity tolerance, Impaired flexibility, Postural dysfunction, Improper body mechanics, Decreased range of motion, Pain, Increased muscle spasms, Decreased mobility, Hypomobility  Visit Diagnosis: Pain in left hip  Other muscle spasm     Problem List Patient Active Problem List   Diagnosis Date Noted  . Insomnia 07/24/2011  . Elevated bilirubin 09/02/2010  . Elevated ferritin 09/02/2010  . Obesity (BMI 30-39.9) 09/02/2010  . Nonspecific elevation of levels of transaminase or lactic acid dehydrogenase (LDH) 09/02/2010  . GERD 09/05/2009  . Palpitations 09/05/2009  . ELEVATED BP READING WITHOUT DX HYPERTENSION 09/05/2009  . OTHER ABNORMAL BLOOD CHEMISTRY 06/15/2009  . ASTHMA 10/30/2006  . ECZEMA 10/30/2006    PHYSICAL THERAPY DISCHARGE SUMMARY  Visits from Start of Care: 8  Current functional level related to goals / functional outcomes: See above for more details    Remaining deficits: See above for more details    Education / Equipment: See above for more details  Plan: Patient agrees to discharge.  Patient goals were met. Patient is being discharged due to meeting the stated rehab goals.  ?????          3:53 PM,08/18/17 SMylo DHerrinat BThompson's [place]  [facility] [address] [place] [address]Phone: [phone]  Fax:  [phone] Name: Brendan HobbyMRN: [id number] of Birth: [birth year]

## 2018-02-05 DIAGNOSIS — Z0184 Encounter for antibody response examination: Secondary | ICD-10-CM | POA: Diagnosis not present

## 2018-06-04 DIAGNOSIS — G479 Sleep disorder, unspecified: Secondary | ICD-10-CM | POA: Diagnosis not present

## 2018-06-04 DIAGNOSIS — I10 Essential (primary) hypertension: Secondary | ICD-10-CM | POA: Diagnosis not present

## 2018-06-04 DIAGNOSIS — E7800 Pure hypercholesterolemia, unspecified: Secondary | ICD-10-CM | POA: Diagnosis not present

## 2018-06-04 DIAGNOSIS — M1711 Unilateral primary osteoarthritis, right knee: Secondary | ICD-10-CM | POA: Diagnosis not present

## 2018-06-04 DIAGNOSIS — M79671 Pain in right foot: Secondary | ICD-10-CM | POA: Diagnosis not present

## 2018-06-04 DIAGNOSIS — Z0000 Encounter for general adult medical examination without abnormal findings: Secondary | ICD-10-CM | POA: Diagnosis not present

## 2018-06-04 DIAGNOSIS — R002 Palpitations: Secondary | ICD-10-CM | POA: Diagnosis not present

## 2018-06-04 DIAGNOSIS — Z6838 Body mass index (BMI) 38.0-38.9, adult: Secondary | ICD-10-CM | POA: Diagnosis not present

## 2019-10-25 ENCOUNTER — Encounter: Payer: Self-pay | Admitting: Podiatry

## 2019-10-25 ENCOUNTER — Other Ambulatory Visit: Payer: Self-pay

## 2019-10-25 ENCOUNTER — Ambulatory Visit (INDEPENDENT_AMBULATORY_CARE_PROVIDER_SITE_OTHER): Payer: Commercial Managed Care - PPO

## 2019-10-25 ENCOUNTER — Ambulatory Visit (INDEPENDENT_AMBULATORY_CARE_PROVIDER_SITE_OTHER): Payer: Commercial Managed Care - PPO | Admitting: Podiatry

## 2019-10-25 DIAGNOSIS — M2011 Hallux valgus (acquired), right foot: Secondary | ICD-10-CM

## 2019-10-25 DIAGNOSIS — S93324A Dislocation of tarsometatarsal joint of right foot, initial encounter: Secondary | ICD-10-CM | POA: Diagnosis not present

## 2019-10-26 ENCOUNTER — Other Ambulatory Visit: Payer: Self-pay | Admitting: Podiatry

## 2019-10-26 DIAGNOSIS — S93324A Dislocation of tarsometatarsal joint of right foot, initial encounter: Secondary | ICD-10-CM

## 2019-10-27 ENCOUNTER — Encounter: Payer: Self-pay | Admitting: Podiatry

## 2019-10-27 NOTE — Progress Notes (Signed)
  Subjective:  Patient ID: Brendan [name], male    DOB: April 30, 1975,  MRN: [id number] HPI Chief Complaint  Patient presents with  . Foot Pain    Medial foot right - aching x 1.5 years, noticed after a trip "pre-covid", never had checked due to COVID and working at home, also bunion deformity starting to get more red and sore with motorcycle boots  . New Patient (Initial Visit)    44 y.o. male presents with the above complaint.   ROS: Denies fever chills nausea vomiting muscle aches pains calf pain back pain chest pain shortness of breath.  Past Medical History:  Diagnosis Date  . Asthma   . Eczema   . GERD (gastroesophageal reflux disease)   . Increased liver enzymes 06/2009   negative hepatitis serology 5-11  . Palpitations    Past Surgical History:  Procedure Laterality Date  . Biopsy of lump on inner thigh     dx scar tissue    Current Outpatient Medications:  .  traZODone (DESYREL) 100 MG tablet, 1 TABLET BY MOUTH AT BEDTIME ORALLY, Disp: , Rfl:  .  valsartan-hydrochlorothiazide (DIOVAN-HCT) 160-25 MG tablet, Take 1 tablet by mouth daily., Disp: , Rfl:   Allergies  Allergen Reactions  . Aspirin   . Penicillins    Review of Systems Objective:  There were no vitals filed for this visit.  General: Well developed, nourished, in no acute distress, alert and oriented x3   Dermatological: Skin is warm, dry and supple bilateral. Nails x 10 are well maintained; remaining integument appears unremarkable at this time. There are no open sores, no preulcerative lesions, no rash or signs of infection present.  Vascular: Dorsalis Pedis artery and Posterior Tibial artery pedal pulses are 2/4 bilateral with immedate capillary fill time. Pedal hair growth present. No varicosities and no lower extremity edema present bilateral.   Neruologic: Grossly intact via light touch bilateral. Vibratory intact via tuning fork bilateral. Protective threshold with Semmes Wienstein monofilament  intact to all pedal sites bilateral. Patellar and Achilles deep tendon reflexes 2+ bilateral. No Babinski or clonus noted bilateral.   Musculoskeletal: No gross boney pedal deformities bilateral. No pain, crepitus, or limitation noted with foot and ankle range of motion bilateral. Muscular strength 5/5 in all groups tested bilateral.  He has significant pain on palpation of the first TMT joint proximally with mild hallux valgus deformity.  There is no pain on range of motion of the first TMT joint.  The majority of his pain is located beneath R plantar to the first TMT joint.  Gait: Unassisted, Nonantalgic.    Radiographs:  Radiographs taken today demonstrate an osseously mature individual fairly rectus foot.  Mild increase in the first intermetatarsal angle greater than normal value with a hallux abductus angle greater than normal value.  He also has a delta phalanx intermediate phalanx second digit right foot.  Soft tissue swelling of medial aspect of the foot.  There appears to be a greater than normal diastases between the second TMT joint and the first TMT joint.  But there does not appear to be a fracture here.  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  Plan: Currently requesting MRI for assistance and diagnostics as well as possible surgical intervention     Chriss Mannan T. Schaller, [place]

## 2019-11-18 ENCOUNTER — Other Ambulatory Visit: Payer: Commercial Managed Care - PPO

## 2019-12-03 ENCOUNTER — Other Ambulatory Visit: Payer: Commercial Managed Care - PPO

## 2019-12-12 ENCOUNTER — Ambulatory Visit
Admission: RE | Admit: 2019-12-12 | Discharge: 2019-12-12 | Disposition: A | Discharge status: Home / Self Care | Payer: Commercial Managed Care - PPO | Source: Ambulatory Visit | Attending: Podiatry | Admitting: Podiatry

## 2019-12-12 ENCOUNTER — Other Ambulatory Visit: Payer: Self-pay

## 2019-12-12 DIAGNOSIS — S93324A Dislocation of tarsometatarsal joint of right foot, initial encounter: Secondary | ICD-10-CM

## 2019-12-13 ENCOUNTER — Telehealth: Payer: Self-pay | Admitting: *Deleted

## 2019-12-13 NOTE — Telephone Encounter (Signed)
Faxed request for copy of MRI disc to [facility]. 

## 2019-12-13 NOTE — Telephone Encounter (Signed)
[facility] called about disc. The [employer]ourier does not run again until Thursday, they will send it out then.

## 2019-12-13 NOTE — Telephone Encounter (Signed)
-----   Message from Elinor Parkinson, [place] sent at 12/12/2019  4:45 PM EST ----- Please request an over read and inform the patient that the MRI was essentially normal and let him know that it will take some time to get this back.

## 2019-12-27 NOTE — Telephone Encounter (Signed)
Disc was sent out to overread services today

## 2020-01-11 ENCOUNTER — Encounter: Payer: Self-pay | Admitting: *Deleted

## 2020-01-11 NOTE — Telephone Encounter (Signed)
Results of overread received today-sent to Dr. Al Corpus for review

## 2020-01-24 ENCOUNTER — Encounter: Payer: Self-pay | Admitting: Podiatry

## 2020-01-24 ENCOUNTER — Ambulatory Visit (INDEPENDENT_AMBULATORY_CARE_PROVIDER_SITE_OTHER): Payer: Commercial Managed Care - PPO | Admitting: Podiatry

## 2020-01-24 ENCOUNTER — Other Ambulatory Visit: Payer: Self-pay

## 2020-01-24 DIAGNOSIS — E7800 Pure hypercholesterolemia, unspecified: Secondary | ICD-10-CM | POA: Insufficient documentation

## 2020-01-24 DIAGNOSIS — G4733 Obstructive sleep apnea (adult) (pediatric): Secondary | ICD-10-CM | POA: Insufficient documentation

## 2020-01-24 DIAGNOSIS — J4521 Mild intermittent asthma with (acute) exacerbation: Secondary | ICD-10-CM | POA: Insufficient documentation

## 2020-01-24 DIAGNOSIS — S93324A Dislocation of tarsometatarsal joint of right foot, initial encounter: Secondary | ICD-10-CM | POA: Diagnosis not present

## 2020-01-24 DIAGNOSIS — M2011 Hallux valgus (acquired), right foot: Secondary | ICD-10-CM

## 2020-01-24 DIAGNOSIS — G5700 Lesion of sciatic nerve, unspecified lower limb: Secondary | ICD-10-CM | POA: Insufficient documentation

## 2020-01-24 DIAGNOSIS — I10 Essential (primary) hypertension: Secondary | ICD-10-CM | POA: Insufficient documentation

## 2020-01-24 DIAGNOSIS — M179 Osteoarthritis of knee, unspecified: Secondary | ICD-10-CM | POA: Insufficient documentation

## 2020-01-24 DIAGNOSIS — E559 Vitamin D deficiency, unspecified: Secondary | ICD-10-CM | POA: Insufficient documentation

## 2020-01-24 NOTE — Progress Notes (Signed)
He presents today for follow-up of his painful right foot.  States that is the same really has not changed at all.  Is about to leave the country for about a month and a half going to [place].  Objective: MRI states that there is a denervation of an intrinsic muscle within the midfoot.  Most likely resulting in his symptoms.  Severe pes planus is noted.  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  Plan: At this point were going to have him start wearing orthotics he will see Raiford Noble for custom built orthotics.  He cannot have a large arch in the orthotics because of the edematous muscle.

## 2020-02-23 ENCOUNTER — Ambulatory Visit (INDEPENDENT_AMBULATORY_CARE_PROVIDER_SITE_OTHER): Payer: Commercial Managed Care - PPO | Admitting: Orthotics

## 2020-02-23 ENCOUNTER — Other Ambulatory Visit: Payer: Self-pay

## 2020-02-23 DIAGNOSIS — M2011 Hallux valgus (acquired), right foot: Secondary | ICD-10-CM

## 2020-02-23 DIAGNOSIS — M79672 Pain in left foot: Secondary | ICD-10-CM

## 2020-02-23 DIAGNOSIS — S93324A Dislocation of tarsometatarsal joint of right foot, initial encounter: Secondary | ICD-10-CM

## 2020-02-23 NOTE — Progress Notes (Signed)
Patient seen today for casting for CMFO to address Hallux valgus/pf right foot; he has evidence of denevervatin intrinsic muscle in arch; so arch must be shallow.  Plan on 4* varus rf wedge to address planovalgus.

## 2020-03-22 ENCOUNTER — Other Ambulatory Visit: Payer: Self-pay

## 2020-03-22 ENCOUNTER — Ambulatory Visit: Payer: Commercial Managed Care - PPO | Admitting: Orthotics

## 2020-03-22 DIAGNOSIS — M2011 Hallux valgus (acquired), right foot: Secondary | ICD-10-CM

## 2020-03-22 NOTE — Progress Notes (Signed)
Patient picked up f/o and was pleased with fit, comfort, and function.  Worked well with footwear.  Told of rbeak in period and how to report any issues.   There were some issues with slippage; patient told to wear in different type of shoes; we may have to remake with a little more shallow and narrow heel cup.

## 2020-04-09 ENCOUNTER — Other Ambulatory Visit: Payer: Commercial Managed Care - PPO | Admitting: Orthotics

## 2020-04-17 ENCOUNTER — Encounter: Payer: Self-pay | Admitting: *Deleted

## 2020-04-17 ENCOUNTER — Ambulatory Visit (INDEPENDENT_AMBULATORY_CARE_PROVIDER_SITE_OTHER): Payer: Commercial Managed Care - PPO | Admitting: Podiatry

## 2020-04-17 ENCOUNTER — Other Ambulatory Visit: Payer: Self-pay

## 2020-04-17 DIAGNOSIS — S93324A Dislocation of tarsometatarsal joint of right foot, initial encounter: Secondary | ICD-10-CM

## 2020-04-17 DIAGNOSIS — M2011 Hallux valgus (acquired), right foot: Secondary | ICD-10-CM

## 2020-04-17 NOTE — Progress Notes (Signed)
Patient presents today that the orthotics that he got are too wide and bulge out when he has them in the shoes. The top cover he would like something else that would last longer and the heel part of the insert is too thick and the padding is too high up at the heel area. The inserts  seem to be slipping on both feet and patient just wants them done right and I stated that we would send them back and get re-adjusted. Misty Stanley

## 2020-04-19 ENCOUNTER — Encounter: Payer: Self-pay | Admitting: Neurology

## 2020-04-19 ENCOUNTER — Ambulatory Visit (INDEPENDENT_AMBULATORY_CARE_PROVIDER_SITE_OTHER): Payer: Commercial Managed Care - PPO | Admitting: Neurology

## 2020-04-19 VITALS — BP 142/80 | HR 78 | Ht 72.0 in | Wt 290.0 lb

## 2020-04-19 DIAGNOSIS — M79605 Pain in left leg: Secondary | ICD-10-CM | POA: Diagnosis not present

## 2020-04-19 NOTE — Progress Notes (Signed)
Chief Complaint  Patient presents with  . New Patient (Initial Visit)    New room, alone. Paper referral from Rodolph Bong, MD/[facility] for pain in lumbar spine, left greater than right leg pain.  Feels his spine is involved and is causing pain. Was on gabapentin but does not want to be on this d/t weight gain. PT done in past and helped. Spinal decompression helps the most.   . PCP    [facility] on [address] in [place]. Most recently saw  Meridee Score, MSN, FNP-BC      ASSESSMENT AND PLAN  Brendan [name] is a 45 y.o. male   Chronic left low back pain, left buttock hip area pain, intermittent left lateral knee pain,  Essentially normal neurological examination, brisk bilateral patellar reflex,  MRI of the lumbar spine from [facility] in January 2022 only showed mild degenerative changes, no significant canal or foraminal narrowing  Patient reported nonrevealing x-ray of left hip, and left knee  Most consistent with musculoskeletal etiology, likely related to his weight, posturing,  Suggested frequent stretching exercise, warm compression, as needed NSAIDs, gabapentin,  Only return to clinic for new issues  DIAGNOSTIC DATA (LABS, IMAGING, TESTING) - I reviewed patient records, labs, notes, testing and imaging myself where available.  MRI of lumbar spine from [facility] January 2022, small brought central right subarticular L4-5 disc protrusion with annular fissure and mild right lateral bulging disc without spinal canal stenosis or contact of the descending nerve roots, no neural foraminal narrowing, mild posterior and right lateral bulging disc with right central annular fissure, L5-S1 without contact of the descending nerve roots   HISTORICAL  Brendan [name] is a 45 year old male, seen in request by sports medicine physician Dr. Penni Bombard, [place] for evaluation of intermittent left low back pain, left hip left knee pain, initial evaluation was on April 19, 2020   I  reviewed and summarized the referring note. PMHx. HTN. Obesity  He works at [employer] job, reported tends to shift his weight to the left side, since 2018, he began to notice intermittent left lateral knee pain, initially it was sharp pain, become constant shortly after its onset, later noticed left hip area pain, sometimes radiating to deep left buttock region, was diagnosed with left iliotibial band pathology initially, was referred to stretching exercise, with only limited relief  He reported worsening symptoms at the beginning of 2022, was evaluated by Dr. Marianna Payment, MRI of lumbar spine only showed mild abnormality, no evidence of canal or foraminal stenosis, seem to slight more pathology on the right side, would not explain his left-sided symptoms  He was referred to physical therapy, stretching treatment, which has really helped his symptoms, gabapentin was helpful as well.  His symptoms began to recur again since March 2022, now he complains of left hip area discomfort, occasionally burning sensation above bilateral patella  He denied persistent sensory loss, no gait abnormality, no bowel bladder incontinence,   PHYSICAL EXAM   Vitals:   04/19/20 1347  BP: (!) 142/80  Pulse: 78  SpO2: 98%  Weight: 290 lb (131.5 kg)  Height: 6' (1.829 m)   Not recorded     Body mass index is 39.33 kg/m.  PHYSICAL EXAMNIATION:  Gen: NAD, conversant, well nourised, well groomed                     Cardiovascular: Regular rate rhythm, no peripheral edema, warm, nontender. Eyes: Conjunctivae clear without exudates or hemorrhage Neck: Supple, no  carotid bruits. Pulmonary: Clear to auscultation bilaterally  Muscular skeleton: Mild left SI joint tenderness upon deep palpitation NEUROLOGICAL EXAM:  MENTAL STATUS: Speech:    Speech is normal; fluent and spontaneous with normal comprehension.  Cognition:     Orientation to time, place and person     Normal recent and remote memory     Normal  Attention span and concentration     Normal Language, naming, repeating,spontaneous speech     Fund of knowledge   CRANIAL NERVES: CN II: Visual fields are full to confrontation. Pupils are round equal and briskly reactive to light. CN III, IV, VI: extraocular movement are normal. No ptosis. CN V: Facial sensation is intact to light touch CN VII: Face is symmetric with normal eye closure  CN VIII: Hearing is normal to causal conversation. CN IX, X: Phonation is normal. CN XI: Head turning and shoulder shrug are intact  MOTOR: There is no pronator drift of out-stretched arms. Muscle bulk and tone are normal. Muscle strength is normal.  REFLEXES: Reflexes are 2+ and symmetric at the biceps, triceps, knees, and ankles. Plantar responses are flexor.  SENSORY: Intact to light touch, pinprick and vibratory sensation are intact in fingers and toes.  COORDINATION: There is no trunk or limb dysmetria noted.  GAIT/STANCE: Posture is normal. Gait is steady with normal steps, base, arm swing, and turning. Heel and toe walking are normal. Tandem gait is normal.  Romberg is absent.  REVIEW OF SYSTEMS: Full 14 system review of systems performed and notable only for as above All other review of systems were negative.  ALLERGIES: Allergies  Allergen Reactions  . Penicillins Anaphylaxis  . Aspirin   . Cyclobenzaprine     Other reaction(s): Rash/Swellling  . Latex Hives and Itching    HOME MEDICATIONS: Current Outpatient Medications  Medication Sig Dispense Refill  . albuterol (VENTOLIN HFA) 108 (90 Base) MCG/ACT inhaler Inhale 2 puffs into the lungs every 6 (six) hours as needed.    . gabapentin (NEURONTIN) 300 MG capsule Take 300 mg by mouth 2 (two) times daily.    . traZODone (DESYREL) 100 MG tablet 1 TABLET BY MOUTH AT BEDTIME ORALLY    . valsartan-hydrochlorothiazide (DIOVAN-HCT) 160-25 MG tablet Take 1 tablet by mouth daily.     No current facility-administered medications for  this visit.    PAST MEDICAL HISTORY: Past Medical History:  Diagnosis Date  . Asthma   . Eczema   . GERD (gastroesophageal reflux disease)   . Headache   . Hypertension   . Increased liver enzymes 06/2009   negative hepatitis serology 5-11  . Joint pain   . Low back pain   . Palpitations     PAST SURGICAL HISTORY: Past Surgical History:  Procedure Laterality Date  . Biopsy of lump on inner thigh     dx scar tissue    FAMILY HISTORY: Family History  Problem Relation Age of Onset  . Hypertension Mother   . Arthritis Mother   . Diabetes Mother   . Diabetes Maternal Grandfather   . Heart attack Neg Hx   . Colon cancer Neg Hx   . Prostate cancer Neg Hx     SOCIAL HISTORY: Social History   Socioeconomic History  . Marital status: Married    Spouse name: Not on file  . Number of children: Not on file  . Years of education: college  . Highest education level: Not on file  Occupational History  . Occupation: [employer], works 60+  hours/week  . Occupation: Research scientist (life sciences)  Tobacco Use  . Smoking status: Never Smoker  . Smokeless tobacco: Never Used  Substance and Sexual Activity  . Alcohol use: Yes    Comment: rare  . Drug use: No  . Sexual activity: Not on file  Other Topics Concern  . Not on file  Social History Narrative   Lives with wife.   Right-handed.      Caffeine use: none   Social Determinants of Corporate investment banker Strain: Not on file  Food Insecurity: Not on file  Transportation Needs: Not on file  Physical Activity: Not on file  Stress: Not on file  Social Connections: Not on file  Intimate Partner Violence: Not on file      Levert Feinstein, M.D. Ph.D.  [facility] [address], [address] [place], [place] [address] Ph: [phone] Fax: [phone]  CC:  Delfin Gant, MD [address] [place],  [place] [address]  [facility]

## 2021-10-21 ENCOUNTER — Ambulatory Visit (INDEPENDENT_AMBULATORY_CARE_PROVIDER_SITE_OTHER): Payer: Commercial Managed Care - PPO | Admitting: Cardiology

## 2021-10-21 ENCOUNTER — Encounter (HOSPITAL_BASED_OUTPATIENT_CLINIC_OR_DEPARTMENT_OTHER): Payer: Self-pay | Admitting: Cardiology

## 2021-10-21 VITALS — BP 130/76 | HR 58 | Ht 72.0 in | Wt 266.3 lb

## 2021-10-21 DIAGNOSIS — Z712 Person consulting for explanation of examination or test findings: Secondary | ICD-10-CM | POA: Diagnosis not present

## 2021-10-21 DIAGNOSIS — I491 Atrial premature depolarization: Secondary | ICD-10-CM | POA: Diagnosis not present

## 2021-10-21 DIAGNOSIS — R002 Palpitations: Secondary | ICD-10-CM

## 2021-10-21 DIAGNOSIS — I10 Essential (primary) hypertension: Secondary | ICD-10-CM

## 2021-10-21 NOTE — Patient Instructions (Signed)
Medication Instructions:  Your Physician recommend you continue on your current medication as directed.    *If you need a refill on your cardiac medications before your next appointment, please call your pharmacy*   Lab Work: None ordered today   Testing/Procedures: None ordered today   Follow-Up: At [facility], you and your health needs are our priority.  As part of our continuing mission to provide you with exceptional heart care, we have created designated Provider Care Teams.  These Care Teams include your primary Cardiologist (physician) and Advanced Practice Providers (APPs -  Physician Assistants and Nurse Practitioners) who all work together to provide you with the care you need, when you need it.  We recommend signing up for the patient portal called "MyChart".  Sign up information is provided on this After Visit Summary.  MyChart is used to connect with patients for Virtual Visits (Telemedicine).  Patients are able to view lab/test results, encounter notes, upcoming appointments, etc.  Non-urgent messages can be sent to your provider as well.   To learn more about what you can do with MyChart, go to [email or web address].    Your next appointment:   As needed  The format for your next appointment:   In Person  Provider:   Bridgette Christopher, MD           

## 2021-10-21 NOTE — Progress Notes (Signed)
Cardiology Office Note:    Date:  10/21/2021   ID:  Brendan [name], DOB 27-Feb-1975, MRN [id number]  PCP:  Kristen Loader, FNP  Cardiologist:  Buford Dresser, MD  Referring MD: Kristen Loader, FNP   CC: new patient consultation for palpitations  History of Present Illness:    Brendan [name] is a 46 y.o. male with a hx of hypertension, palpitations, asthma, GERD, who is seen as a new consult at the request of Kristen Loader, FNP for the evaluation and management of palpitations, hypertension, ventricular bigeminy.   He was seen on 09/17/2021 by Jillyn Ledger, [place], for a cardiology referral. He reported that he has dealt with a palpitations since 2011. He had never seen a cardiologist before.  Today:  He appears to be doing well. He states his palpitations have been occurring for probably about 11 years.   He feels these palpitations often, particularly when he is extremely tired and trying to go to sleep. He reports that if he goes to sleep late one night, the palpitations will be worse the next night. He endorses it never goes away completely.   There was a week recently where he and his wife were getting to bed at odd hours and he had very poor sleep. He states that during this time, his heart beat became even more irregular, with longer pauses between rapid beats. He denies having an actual racing heart rate.   He uses the treadmill for about 1 hour each day but never really notices bad palpitations during this.   He does not drink much caffeine. He believes caffeine makes it difficult for him to sleep. If he does drink coffee, it is decaf.   He denies any chest pain, shortness of breath, or peripheral edema. No lightheadedness, headaches, syncope, orthopnea, or PND.  Past Medical History:  Diagnosis Date   Asthma    Eczema    GERD (gastroesophageal reflux disease)    Headache    Hypertension    Increased liver enzymes 06/2009   negative hepatitis serology 5-11    Joint pain    Low back pain    Palpitations     Past Surgical History:  Procedure Laterality Date   Biopsy of lump on inner thigh     dx scar tissue    Current Medications: Current Outpatient Medications on File Prior to Visit  Medication Sig   albuterol (VENTOLIN HFA) 108 (90 Base) MCG/ACT inhaler Inhale 2 puffs into the lungs every 6 (six) hours as needed.   betamethasone dipropionate 0.05 % cream Apply 1 Application topically 2 (two) times daily as needed.   ciclopirox (PENLAC) 8 % solution Apply 1 Application topically daily.   gabapentin (NEURONTIN) 300 MG capsule Take 300 mg by mouth 2 (two) times daily.   naproxen sodium (ALEVE) 220 MG tablet Take 220 mg by mouth.   traZODone (DESYREL) 100 MG tablet 1 TABLET BY MOUTH AT BEDTIME ORALLY   valsartan-hydrochlorothiazide (DIOVAN-HCT) 160-25 MG tablet Take 1 tablet by mouth daily.   No current facility-administered medications on file prior to visit.     Allergies:   Penicillins, Aspirin, Cyclobenzaprine, and Latex   Social History   Tobacco Use   Smoking status: Never   Smokeless tobacco: Never  Substance Use Topics   Alcohol use: Yes    Comment: rare   Drug use: No    Family History: family history includes Arthritis in his mother; Diabetes in his maternal grandfather and mother; Hypertension in his  mother. There is no history of Heart attack, Colon cancer, or Prostate cancer.  ROS:   Please see the history of present illness.   (+) Palpitations   Additional pertinent ROS otherwise negative.   EKGs/Labs/Other Studies Reviewed:    The following studies were reviewed today: Echo report 09/26/19 (scanned in notes) EF 55-60%, normal wall motion. Trivial MR. No other significant abnormalities.  Monitor report 09/25/2009 (scanned in notes) Min HR 42, Max HR 127, Avg 69 bpm. 48 hours of analysis. 22 PVCs (<0.01% burden). 38 runs of SVT, longest 20 beats. Max PAC/min 22, avg PAC/hour 190 beats, avg PAC/hr 84 beats.  SV ectopy burden 2.13%.    EKG:  EKG is personally reviewed.   10/21/21: Sinus bradycardia. Rate 58 bpm. Left axis deviation.  Recent Labs: No results found for requested labs within last 365 days.  Recent Lipid Panel    Component Value Date/Time   CHOL 189 07/10/2010 0837   TRIG 163.0 (H) 07/10/2010 0837   HDL 35.10 (L) 07/10/2010 0837   CHOLHDL 5 07/10/2010 0837   VLDL 32.6 07/10/2010 0837   LDLCALC 121 (H) 07/10/2010 0837   LDLDIRECT 144.6 06/13/2009 0841    Physical Exam:    VS:  BP 130/76   Pulse (!) 58   Ht 6' (1.829 m)   Wt 266 lb 4.8 oz (120.8 kg)   SpO2 97%   BMI 36.12 kg/m     Wt Readings from Last 3 Encounters:  10/21/21 266 lb 4.8 oz (120.8 kg)  04/19/20 290 lb (131.5 kg)  07/24/11 278 lb (126.1 kg)    GEN: Well nourished, well developed in no acute distress HEENT: Normal, moist mucous membranes NECK: No JVD CARDIAC: regular rhythm, normal S1 and S2, no rubs or gallops. No murmur. VASCULAR: Radial and DP pulses 2+ bilaterally. No carotid bruits RESPIRATORY:  Clear to auscultation without rales, wheezing or rhonchi  ABDOMEN: Soft, non-tender, non-distended MUSCULOSKELETAL:  Ambulates independently SKIN: Warm and dry, no edema NEUROLOGIC:  Alert and oriented x 3. No focal neuro deficits noted. PSYCHIATRIC:  Normal affect    ASSESSMENT:    1. Heart palpitations   2. Primary hypertension   3. PAC (premature atrial contraction)   4. Encounter to discuss test results    PLAN:    Palpitations -we discussed the potential causes of fast heart rates and palpitations today. Reviewed the normal electrical system of the heart. We discussed that there can be other rhythm issues, from either the top or bottom of the heart, that are abnormal rhythms. Discussed how we evaluate for these.  -I reviewed his prior Holter monitor with him. Based on these results, and the description he gives of feeling a pattern of a gap between beats, I suspect he is having PACs -we  discussed options for further evaluation, including a Zio monitor -after discussion, plan is to monitor for a change in symptoms. If this occurs, he will contact me and we will pursue a monitor -counseled on red flag warning signs that need immediate medical attention  Hypertension: -at goal today, continue valsartan-HCTZ  Plan for follow up: PRN.  Buford Dresser, MD, PhD, [facility]    Medication Adjustments/Labs and Tests Ordered: Current medicines are reviewed at length with the patient today.  Concerns regarding medicines are outlined above.  Orders Placed This Encounter  Procedures   EKG 12-Lead   No orders of the defined types were placed in this encounter.  Patient Instructions  Medication Instructions:  Your Physician recommend you continue on your current medication as directed.    *If you need a refill on your cardiac medications before your next appointment, please call your pharmacy*   Lab Work: None ordered today   Testing/Procedures: None ordered today   Follow-Up: At [facility], you and your health needs are our priority.  As part of our continuing mission to provide you with exceptional heart care, we have created designated Provider Care Teams.  These Care Teams include your primary Cardiologist (physician) and Advanced Practice Providers (APPs -  Physician Assistants and Nurse Practitioners) who all work together to provide you with the care you need, when you need it.  We recommend signing up for the patient portal called "MyChart".  Sign up information is provided on this After Visit Summary.  MyChart is used to connect with patients for Virtual Visits (Telemedicine).  Patients are able to view lab/test results, encounter notes, upcoming appointments, etc.  Non-urgent messages can be sent to your provider as well.   To learn more about what you can do with MyChart, go to [email or web address].    Your next  appointment:   As needed  The format for your next appointment:   In Person  Provider:   Buford Dresser, MD              I,Breanna Adamick,acting as a scribe for Buford Dresser, MD.,have documented all relevant documentation on the behalf of Buford Dresser, MD,as directed by  Buford Dresser, MD while in the presence of Buford Dresser, MD.  I, Buford Dresser, MD, have reviewed all documentation for this visit. The documentation on 10/21/21 for the exam, diagnosis, procedures, and orders are all accurate and complete.   Signed, Buford Dresser, MD PhD 10/21/2021 6:00 PM    [place]

## 2022-07-09 IMAGING — MR MR FOOT*R* W/O CM
4 of 7 series · 13 of 40 positions shown · non-contrast
Comparison: Plain films right foot 10/25/2019.

CLINICAL DATA: Midfoot pain for approximately 1 year. No known
injury.

EXAM:
MRI OF THE RIGHT FOOT WITHOUT CONTRAST
TECHNIQUE: Multiplanar, multisequence MR imaging of the foot was performed. No
intravenous contrast was administered.

[Series 7: PD fat-sat · axial · right · 3.0mm · 0.34mm/px · z∈[-90,+106]mm · 4 of 50 slices shown]
[im 1/50]
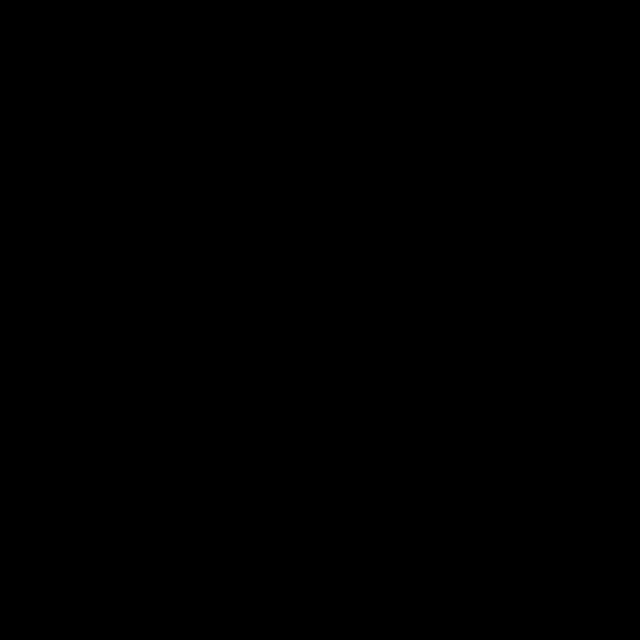
[im 10/50]
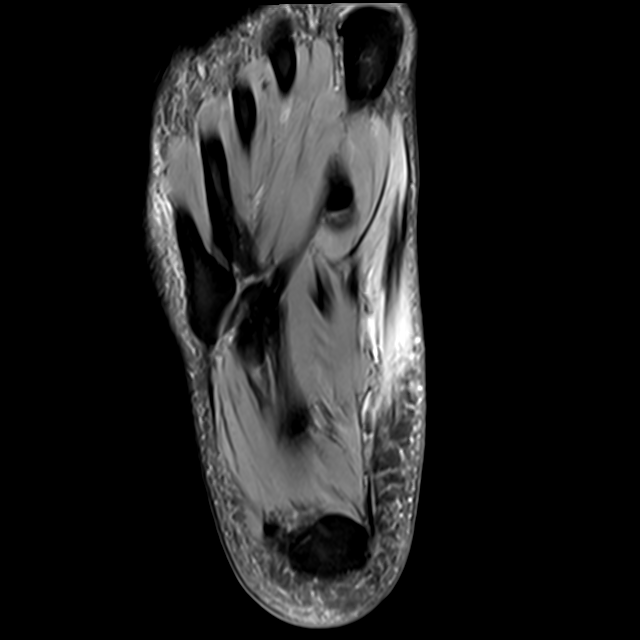
[im 30/50]
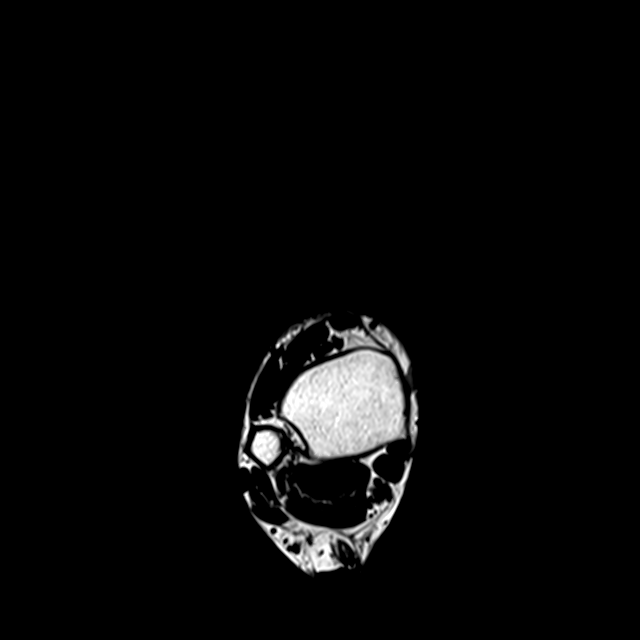
[im 50/50]
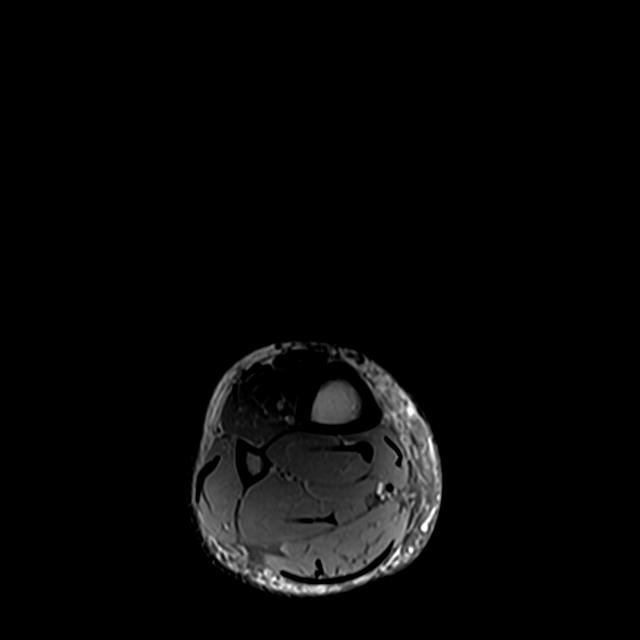

[Series 8: T2 fat-sat · axial · right · 3.0mm · 0.34mm/px · z∈[-54,+106]mm · 3 of 50 slices shown (1 of 2)]
[im 10/50]
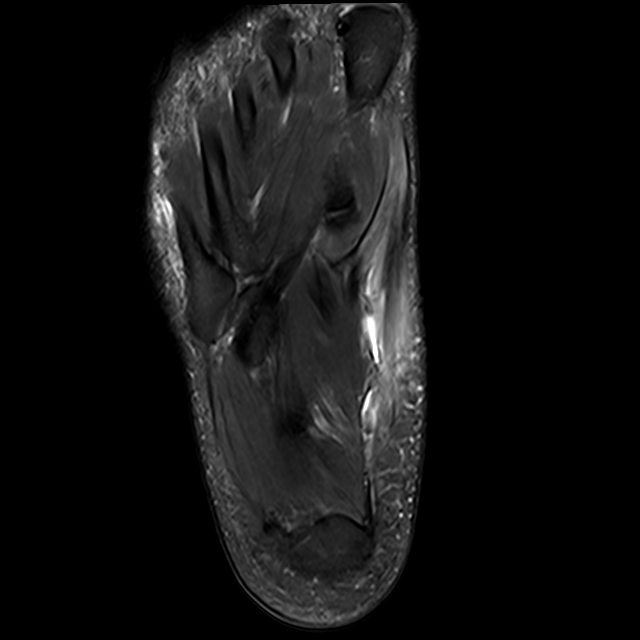
[im 30/50]
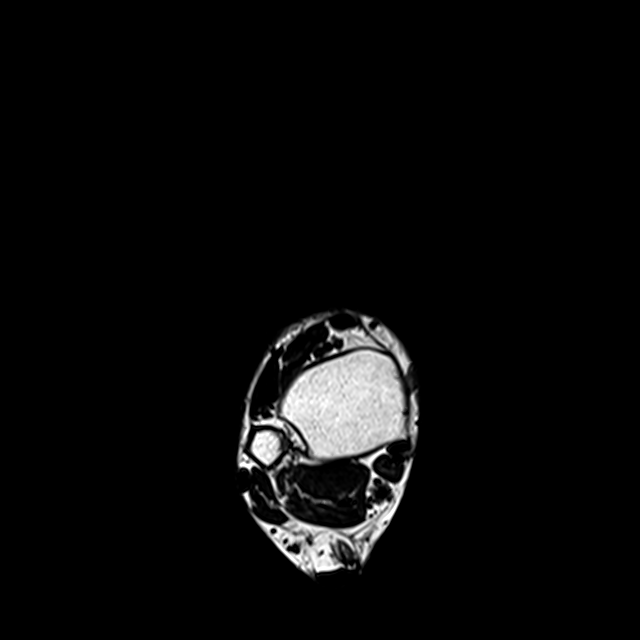
[im 50/50]
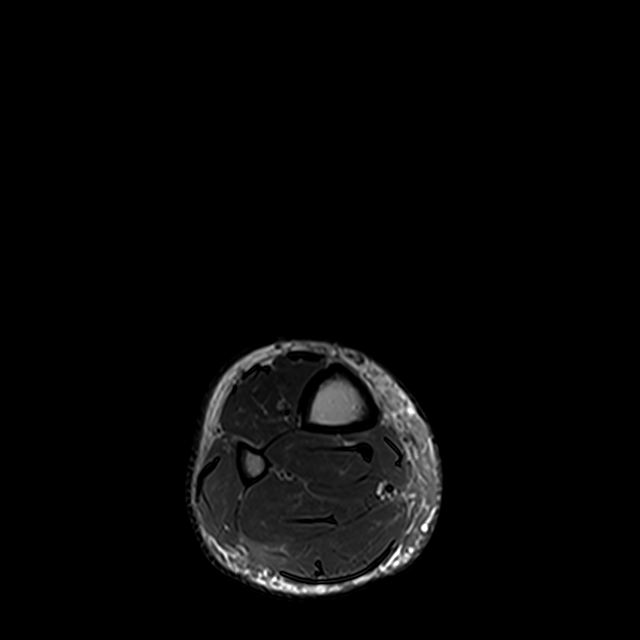

[Series 9: T1 · axial · right · 3.0mm · 0.34mm/px · z∈[-54,+106]mm · 3 of 50 slices shown]
[im 10/50]
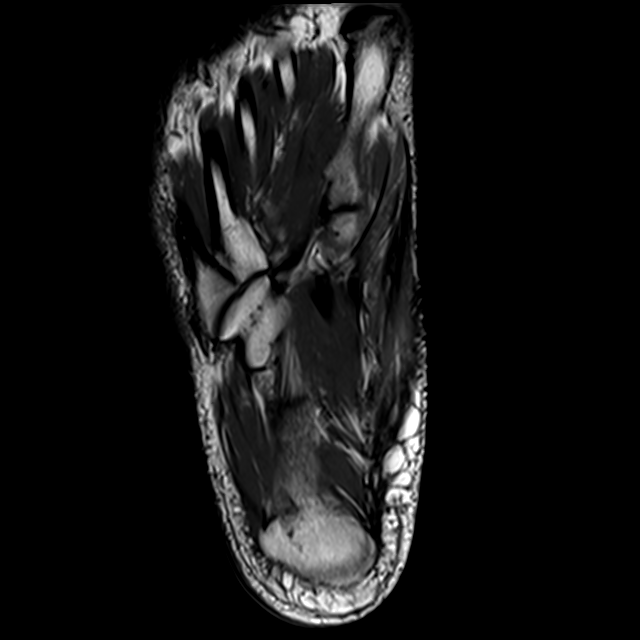
[im 30/50]
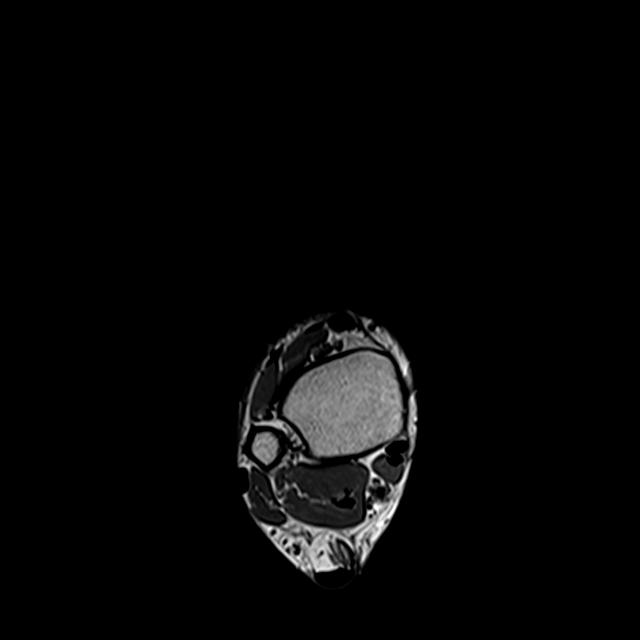
[im 50/50]
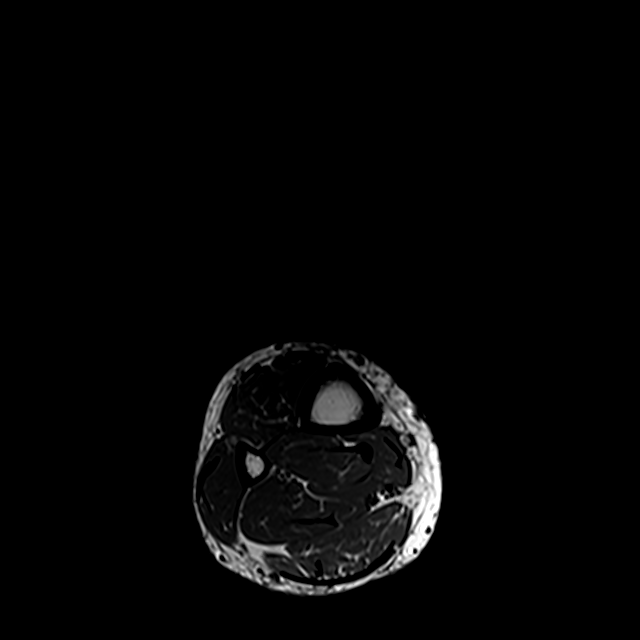

[Series 10: T2 fat-sat · coronal · right · 3.0mm · 0.28mm/px · 3 of 60 slices shown (2 of 2)]
[im 10/60]
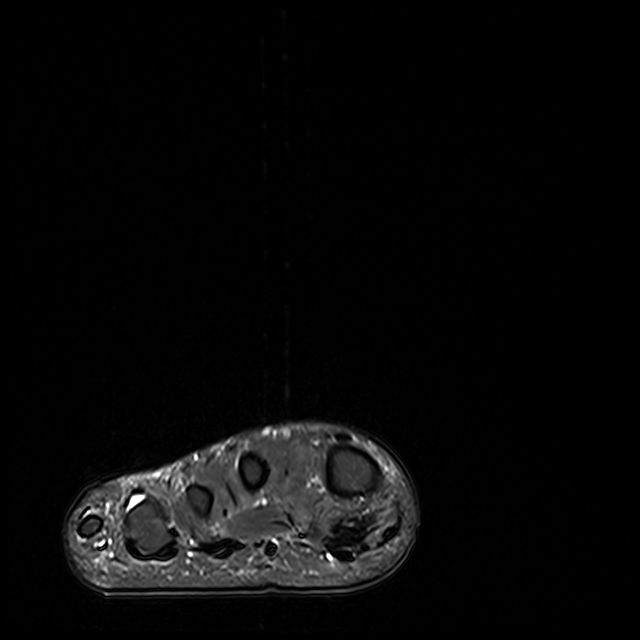
[im 30/60]
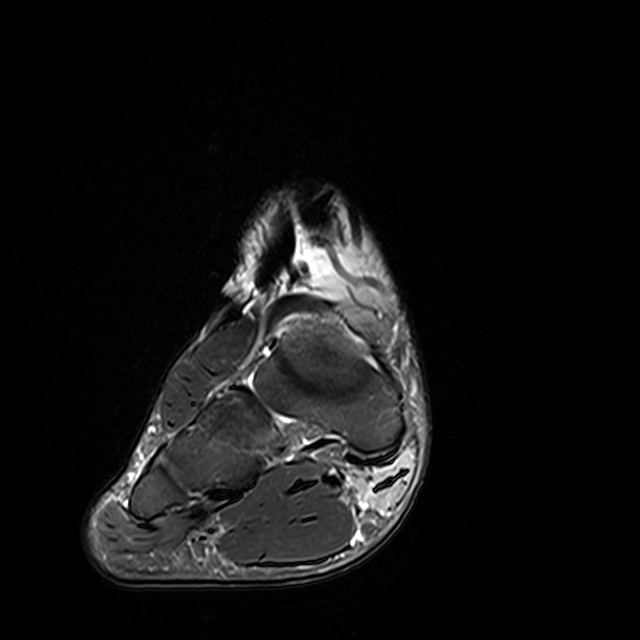
[im 50/60]
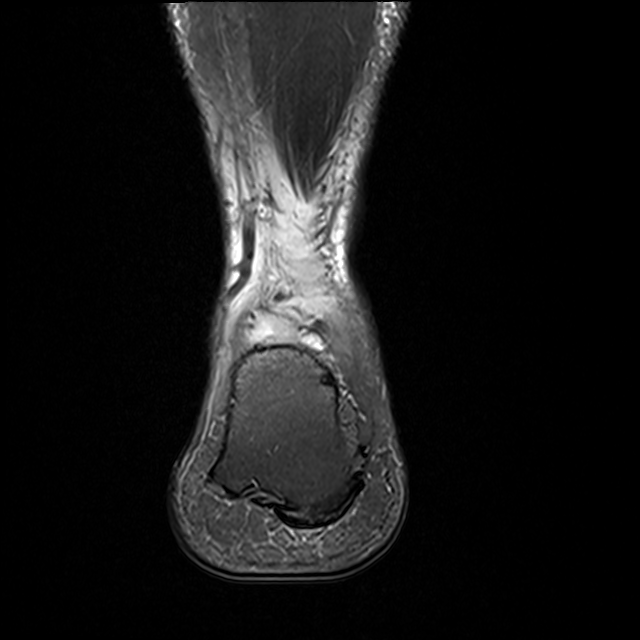

[13 of 40 positions shown; findings below may reference images not displayed]

FINDINGS: Bones/Joint/Cartilage

Marrow signal is normal without fracture, stress change or worrisome
lesion. No evidence of arthropathy.

Ligaments

Intact and normal in appearance. In particular, the Lisfranc
ligament appears normal.

Muscles and Tendons

There is edema and moderate fatty atrophy of the abductor hallucis
muscle without strain or tear. No fluid collection or mass impinging
on the medial plantar nerve is identified. Musculature otherwise
appears normal.

Soft tissues

Normal.  No fluid collection or mass.
IMPRESSION: Findings consistent with denervation atrophy of the abductor
hallucis muscle. Cause for the abnormality is not identified. The
study is otherwise negative.

## 2022-08-12 ENCOUNTER — Emergency Department (HOSPITAL_BASED_OUTPATIENT_CLINIC_OR_DEPARTMENT_OTHER): Payer: Commercial Managed Care - PPO

## 2022-08-12 ENCOUNTER — Encounter (HOSPITAL_COMMUNITY): Payer: Self-pay | Admitting: *Deleted

## 2022-08-12 ENCOUNTER — Encounter (HOSPITAL_BASED_OUTPATIENT_CLINIC_OR_DEPARTMENT_OTHER): Payer: Self-pay

## 2022-08-12 ENCOUNTER — Ambulatory Visit (INDEPENDENT_AMBULATORY_CARE_PROVIDER_SITE_OTHER): Payer: Commercial Managed Care - PPO

## 2022-08-12 ENCOUNTER — Ambulatory Visit (HOSPITAL_COMMUNITY)
Admission: EM | Admit: 2022-08-12 | Discharge: 2022-08-12 | Disposition: A | Discharge status: Home / Self Care | Payer: Commercial Managed Care - PPO | Attending: Emergency Medicine | Admitting: Emergency Medicine

## 2022-08-12 ENCOUNTER — Observation Stay (HOSPITAL_BASED_OUTPATIENT_CLINIC_OR_DEPARTMENT_OTHER)
Admission: EM | Admit: 2022-08-12 | Discharge: 2022-08-14 | Disposition: A | Discharge status: Home / Self Care | Payer: Commercial Managed Care - PPO | Attending: Surgery | Admitting: Surgery

## 2022-08-12 ENCOUNTER — Other Ambulatory Visit: Payer: Self-pay

## 2022-08-12 DIAGNOSIS — I10 Essential (primary) hypertension: Secondary | ICD-10-CM | POA: Diagnosis not present

## 2022-08-12 DIAGNOSIS — R1031 Right lower quadrant pain: Secondary | ICD-10-CM

## 2022-08-12 DIAGNOSIS — K3580 Unspecified acute appendicitis: Secondary | ICD-10-CM | POA: Diagnosis present

## 2022-08-12 DIAGNOSIS — J45909 Unspecified asthma, uncomplicated: Secondary | ICD-10-CM | POA: Diagnosis not present

## 2022-08-12 DIAGNOSIS — K353 Acute appendicitis with localized peritonitis, without perforation or gangrene: Secondary | ICD-10-CM | POA: Diagnosis not present

## 2022-08-12 DIAGNOSIS — Z91040 Latex allergy status: Secondary | ICD-10-CM | POA: Insufficient documentation

## 2022-08-12 DIAGNOSIS — K37 Unspecified appendicitis: Secondary | ICD-10-CM | POA: Diagnosis present

## 2022-08-12 DIAGNOSIS — Z79899 Other long term (current) drug therapy: Secondary | ICD-10-CM | POA: Insufficient documentation

## 2022-08-12 LAB — URINALYSIS, ROUTINE W REFLEX MICROSCOPIC
Bilirubin Urine: NEGATIVE
Glucose, UA: NEGATIVE mg/dL
Hgb urine dipstick: NEGATIVE
Ketones, ur: 40 mg/dL — AB
Leukocytes,Ua: NEGATIVE
Nitrite: NEGATIVE
Protein, ur: NEGATIVE mg/dL
Specific Gravity, Urine: 1.014 (ref 1.005–1.030)
pH: 7 (ref 5.0–8.0)

## 2022-08-12 LAB — CBC
HCT: 43 % (ref 39.0–52.0)
Hemoglobin: 15.3 g/dL (ref 13.0–17.0)
MCH: 31.9 pg (ref 26.0–34.0)
MCHC: 35.6 g/dL (ref 30.0–36.0)
MCV: 89.8 fL (ref 80.0–100.0)
Platelets: 200 10*3/uL (ref 150–400)
RBC: 4.79 MIL/uL (ref 4.22–5.81)
RDW: 12.8 % (ref 11.5–15.5)
WBC: 13.1 10*3/uL — ABNORMAL HIGH (ref 4.0–10.5)
nRBC: 0 % (ref 0.0–0.2)

## 2022-08-12 LAB — POCT URINALYSIS DIP (MANUAL ENTRY)
Bilirubin, UA: NEGATIVE
Glucose, UA: NEGATIVE mg/dL
Leukocytes, UA: NEGATIVE
Nitrite, UA: NEGATIVE
Protein Ur, POC: NEGATIVE mg/dL
Spec Grav, UA: 1.02 (ref 1.010–1.025)
Urobilinogen, UA: 0.2 E.U./dL
pH, UA: 7 (ref 5.0–8.0)

## 2022-08-12 LAB — COMPREHENSIVE METABOLIC PANEL
ALT: 53 U/L — ABNORMAL HIGH (ref 0–44)
AST: 30 U/L (ref 15–41)
Albumin: 5 g/dL (ref 3.5–5.0)
Alkaline Phosphatase: 51 U/L (ref 38–126)
Anion gap: 11 (ref 5–15)
BUN: 17 mg/dL (ref 6–20)
CO2: 24 mmol/L (ref 22–32)
Calcium: 9.8 mg/dL (ref 8.9–10.3)
Chloride: 99 mmol/L (ref 98–111)
Creatinine, Ser: 0.84 mg/dL (ref 0.61–1.24)
GFR, Estimated: >60 mL/min (ref >60)
Glucose, Bld: 116 mg/dL — ABNORMAL HIGH (ref 70–99)
Potassium: 3.5 mmol/L (ref 3.5–5.1)
Sodium: 134 mmol/L — ABNORMAL LOW (ref 135–145)
Total Bilirubin: 1.9 mg/dL — ABNORMAL HIGH (ref 0.3–1.2)
Total Protein: 7.6 g/dL (ref 6.5–8.1)

## 2022-08-12 LAB — LIPASE, BLOOD: Lipase: 24 U/L (ref 11–51)

## 2022-08-12 MED ORDER — IRBESARTAN 150 MG PO TABS
150.0000 mg | ORAL_TABLET | Freq: Every day | ORAL | Status: DC
  Administered 2022-08-14: 150 mg via ORAL
  Filled 2022-08-12: qty 1

## 2022-08-12 MED ORDER — MORPHINE SULFATE (PF) 4 MG/ML IV SOLN
4.0000 mg | INTRAVENOUS | Status: DC | PRN
  Administered 2022-08-12 – 2022-08-13 (×3): 4 mg via INTRAVENOUS
  Filled 2022-08-12 (×3): qty 1

## 2022-08-12 MED ORDER — OXYCODONE HCL 5 MG PO TABS
5.0000 mg | ORAL_TABLET | ORAL | Status: DC | PRN
  Administered 2022-08-13: 5 mg via ORAL
  Filled 2022-08-12: qty 1

## 2022-08-12 MED ORDER — TRAZODONE HCL 100 MG PO TABS
100.0000 mg | ORAL_TABLET | Freq: Every day | ORAL | Status: DC
  Administered 2022-08-12 – 2022-08-13 (×2): 100 mg via ORAL
  Filled 2022-08-12 (×2): qty 1

## 2022-08-12 MED ORDER — METRONIDAZOLE 500 MG/100ML IV SOLN
500.0000 mg | Freq: Two times a day (BID) | INTRAVENOUS | Status: DC
  Administered 2022-08-13 – 2022-08-14 (×3): 500 mg via INTRAVENOUS
  Filled 2022-08-12 (×3): qty 100

## 2022-08-12 MED ORDER — PANTOPRAZOLE SODIUM 40 MG IV SOLR
40.0000 mg | Freq: Every day | INTRAVENOUS | Status: DC
  Administered 2022-08-12 – 2022-08-13 (×2): 40 mg via INTRAVENOUS
  Filled 2022-08-12 (×2): qty 10

## 2022-08-12 MED ORDER — METRONIDAZOLE 500 MG/100ML IV SOLN
500.0000 mg | Freq: Once | INTRAVENOUS | Status: AC
  Administered 2022-08-12: 500 mg via INTRAVENOUS
  Filled 2022-08-12: qty 100

## 2022-08-12 MED ORDER — IOHEXOL 300 MG/ML  SOLN
100.0000 mL | Freq: Once | INTRAMUSCULAR | Status: AC | PRN
  Administered 2022-08-12: 85 mL via INTRAVENOUS

## 2022-08-12 MED ORDER — CIPROFLOXACIN IN D5W 400 MG/200ML IV SOLN
400.0000 mg | Freq: Once | INTRAVENOUS | Status: AC
  Administered 2022-08-12: 400 mg via INTRAVENOUS
  Filled 2022-08-12: qty 200

## 2022-08-12 MED ORDER — HYDROMORPHONE HCL 1 MG/ML IJ SOLN
0.5000 mg | Freq: Once | INTRAMUSCULAR | Status: AC
  Administered 2022-08-12: 0.5 mg via INTRAVENOUS
  Filled 2022-08-12: qty 1

## 2022-08-12 MED ORDER — CIPROFLOXACIN IN D5W 400 MG/200ML IV SOLN
400.0000 mg | Freq: Two times a day (BID) | INTRAVENOUS | Status: DC
  Administered 2022-08-13 – 2022-08-14 (×3): 400 mg via INTRAVENOUS
  Filled 2022-08-12 (×3): qty 200

## 2022-08-12 MED ORDER — ONDANSETRON HCL 4 MG/2ML IJ SOLN
4.0000 mg | Freq: Once | INTRAMUSCULAR | Status: AC
  Administered 2022-08-12: 4 mg via INTRAVENOUS
  Filled 2022-08-12: qty 2

## 2022-08-12 MED ORDER — ONDANSETRON 4 MG PO TBDP: 4.0000 mg | ORAL_TABLET | Freq: Four times a day (QID) | ORAL | Status: DC | PRN

## 2022-08-12 MED ORDER — POTASSIUM CHLORIDE IN NACL 20-0.45 MEQ/L-% IV SOLN
INTRAVENOUS | Status: DC
  Filled 2022-08-12 (×2): qty 1000

## 2022-08-12 MED ORDER — VALSARTAN-HYDROCHLOROTHIAZIDE 160-25 MG PO TABS: 1.0000 | ORAL_TABLET | Freq: Every day | ORAL | Status: DC

## 2022-08-12 MED ORDER — HYDROCHLOROTHIAZIDE 25 MG PO TABS
25.0000 mg | ORAL_TABLET | Freq: Every day | ORAL | Status: DC
  Administered 2022-08-14: 25 mg via ORAL
  Filled 2022-08-12: qty 1

## 2022-08-12 MED ORDER — GABAPENTIN 300 MG PO CAPS
300.0000 mg | ORAL_CAPSULE | Freq: Two times a day (BID) | ORAL | Status: DC
  Administered 2022-08-12 – 2022-08-14 (×3): 300 mg via ORAL
  Filled 2022-08-12 (×3): qty 1

## 2022-08-12 MED ORDER — ALBUTEROL SULFATE (2.5 MG/3ML) 0.083% IN NEBU: 3.0000 mL | INHALATION_SOLUTION | RESPIRATORY_TRACT | Status: DC | PRN

## 2022-08-12 MED ORDER — ONDANSETRON HCL 4 MG/2ML IJ SOLN
4.0000 mg | Freq: Four times a day (QID) | INTRAMUSCULAR | Status: DC | PRN
  Administered 2022-08-13: 4 mg via INTRAVENOUS

## 2022-08-12 NOTE — ED Notes (Signed)
Called the floor to give report on Pt. I was asked to call back after shift change and give report then. I advised [facility] is on the way to pick Pt up and would be arriving to [facility] shortly.

## 2022-08-12 NOTE — ED Provider Notes (Signed)
[place] [facility] AT [facility] Provider Note   CSN: [id number] Arrival date & time: 08/12/22  1230     History  Chief Complaint  Patient presents with   Abdominal Pain    Brendan [name] is a 47 y.o. male.  Pt with no past surgical history -- presents for eval of RLQ pain. He awoke with pain this AM, felt like he needed to have a BM and eventually did. But pain persisted. It worsened throughout the morning. No hematuria or irritative UTI symptoms including dysuria, increased frequency or urgency. No blood in stool. Decreased appetite but no vomiting. Last ate 6:30am, has had a few sips of fluid. Movement, palpation, bumps in the car, make the pain a lot worse. It has increased to 8/10.        Home Medications Prior to Admission medications   Medication Sig Start Date End Date Taking? Authorizing Provider  albuterol (VENTOLIN HFA) 108 (90 Base) MCG/ACT inhaler Inhale 2 puffs into the lungs every 6 (six) hours as needed. 01/20/20   [provider]  betamethasone dipropionate 0.05 % cream Apply 1 Application topically 2 (two) times daily as needed. 08/16/21   [provider]  gabapentin (NEURONTIN) 300 MG capsule Take 300 mg by mouth 2 (two) times daily. 01/15/20   [provider]  naproxen sodium (ALEVE) 220 MG tablet Take 220 mg by mouth.    [provider]  traZODone (DESYREL) 100 MG tablet 1 TABLET BY MOUTH AT BEDTIME ORALLY 10/08/19   [provider]  valsartan-hydrochlorothiazide (DIOVAN-HCT) 160-25 MG tablet Take 1 tablet by mouth daily. 09/14/19   [provider]      Allergies    Penicillins, Aspirin, Cyclobenzaprine, and Latex    Review of Systems   Review of Systems  Physical Exam Updated Vital Signs BP 127/85 (BP Location: Right Arm)   Pulse 69   Temp 97.9 F (36.6 C) (Oral)   Resp 18   Ht 6' (1.829 m)   Wt 117.9 kg   SpO2 99%   BMI 35.26 kg/m  Physical Exam Vitals and nursing  note reviewed.  Constitutional:      General: He is not in acute distress.    Appearance: He is well-developed.  HENT:     Head: Normocephalic and atraumatic.  Eyes:     General:        Right eye: No discharge.        Left eye: No discharge.     Conjunctiva/sclera: Conjunctivae normal.  Cardiovascular:     Rate and Rhythm: Normal rate and regular rhythm.     Heart sounds: Normal heart sounds.  Pulmonary:     Effort: Pulmonary effort is normal.     Breath sounds: Normal breath sounds.  Abdominal:     Palpations: Abdomen is soft.     Tenderness: There is abdominal tenderness in the right lower quadrant. There is guarding and rebound. Positive signs include Rovsing's sign and McBurney's sign. Negative signs include Murphy's sign.  Musculoskeletal:     Cervical back: Normal range of motion and neck supple.  Skin:    General: Skin is warm and dry.  Neurological:     Mental Status: He is alert.     ED Results / Procedures / Treatments   Labs (all labs ordered are listed, but only abnormal results are displayed) Labs Reviewed  COMPREHENSIVE METABOLIC PANEL - Abnormal; Notable for the following components:      Result Value   Sodium  134 (*)    Glucose, Bld 116 (*)    ALT 53 (*)    Total Bilirubin 1.9 (*)    All other components within normal limits  CBC - Abnormal; Notable for the following components:   WBC 13.1 (*)    All other components within normal limits  URINALYSIS, ROUTINE W REFLEX MICROSCOPIC - Abnormal; Notable for the following components:   Ketones, ur 40 (*)    All other components within normal limits  LIPASE, BLOOD    EKG None  Radiology DG Abd 2 Views  Result Date: 08/12/2022 CLINICAL DATA:  Acute onset right abdominal pain EXAM: ABDOMEN - 2 VIEW COMPARISON:  None available FINDINGS: The bowel gas pattern is normal. There is no evidence of free air. No radio-opaque calculi or other significant radiographic abnormality is seen. IMPRESSION: Negative.  Electronically Signed   By: Acquanetta Belling M.D.   On: 08/12/2022 11:59    Procedures Procedures    Medications Ordered in ED Medications  HYDROmorphone (DILAUDID) injection 0.5 mg (has no administration in time range)  ondansetron (ZOFRAN) injection 4 mg (has no administration in time range)    ED Course/ Medical Decision Making/ A&P    Patient seen and examined. History obtained directly from patient and family member.   Labs/EKG: CBC with WBC 13.1; CMP unremarkable; lipase nml; UA nml.   Imaging: Ordered CT abd/pelvis.  Medications/Fluids: Ordered: dlaudid, zofran.   Most recent vital signs reviewed and are as follows: BP 127/85 (BP Location: Right Arm)   Pulse 69   Temp 97.9 F (36.6 C) (Oral)   Resp 18   Ht 6' (1.829 m)   Wt 117.9 kg   SpO2 99%   BMI 35.26 kg/m   Initial impression: high clinical concern for acute appendicitis.   6:06 PM Reassessment performed. Patient appears stable during several rechecks.  Imaging personally visualized and interpreted including: CT imaging, agree acute appendicitis  Reviewed pertinent lab work and imaging with patient at bedside. Questions answered.   Most current vital signs reviewed and are as follows: BP 130/72 (BP Location: Right Arm)   Pulse 60   Temp 98.3 F (36.8 C) (Oral)   Resp 16   Ht 6' (1.829 m)   Wt 117.9 kg   SpO2 98%   BMI 35.26 kg/m   Plan: Admit to hospital.  Antibiotics have been ordered and given.  Case discussed with Dr. Violeta Gelinas general surgery who accepts patient for admission.  Request that I place orders for bed at [facility].  Pt updated.                             Medical Decision Making Amount and/or Complexity of Data Reviewed Labs: ordered. Radiology: ordered.  Risk Prescription drug management. Decision regarding hospitalization.   For this patient's complaint of abdominal pain, the following conditions were considered on the differential diagnosis: gastritis/PUD,  enteritis/duodenitis, appendicitis, cholelithiasis/cholecystitis, cholangitis, pancreatitis, ruptured viscus, colitis, diverticulitis, small/large bowel obstruction, proctitis, cystitis, pyelonephritis, ureteral colic, aortic dissection, aortic aneurysm. Atypical chest etiologies were also considered including ACS, PE, and pneumonia.        Final Clinical Impression(s) / ED Diagnoses Final diagnoses:  Acute appendicitis with localized peritonitis, without perforation, abscess, or gangrene    Rx / DC Orders ED Discharge Orders     None         Renne Crigler, PA-C 08/12/22 1807    Long, Arlyss Repress, MD 08/14/22  0821  

## 2022-08-12 NOTE — ED Notes (Signed)
Called [facility] -- pt needing transport, has bed assignment

## 2022-08-12 NOTE — ED Provider Notes (Signed)
[facility]    CSN: [id number] Arrival date & time: 08/12/22  1033    HISTORY   Chief Complaint  Patient presents with   Abdominal Pain   HPI Brendan [name] is a pleasant, 47 y.o. male who presents to urgent care today. Patient complains of being awakened at 6 AM this morning with acute onset of right-sided abdominal pain in his right lower quadrant.  Patient states he is never had similar pain in the past.  Patient has normal vital signs on arrival today, does not appear to be diaphoretic or in any acute distress.  Patient denies fever or trying any medications or home remedies to alleviate his pain.  Patient reports regular, daily bowel movements, states he tries to eat a lot of high-fiber foods.  The history is provided by the patient.   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   Vitamin D deficiency 01/24/2020   Insomnia 07/24/2011   Elevated bilirubin 09/02/2010   Elevated ferritin 09/02/2010   Obesity (BMI 30-39.9) 09/02/2010   Nonspecific elevation of levels of transaminase or lactic acid dehydrogenase (LDH) 09/02/2010   GERD 09/05/2009   Palpitations 09/05/2009   ELEVATED BP READING WITHOUT DX HYPERTENSION 09/05/2009   OTHER ABNORMAL BLOOD CHEMISTRY 06/15/2009   ASTHMA 10/30/2006   ECZEMA 10/30/2006   Past Surgical History:  Procedure Laterality Date   Biopsy of lump on inner thigh     dx scar tissue    Home Medications    Prior to  Admission medications   Medication Sig Start Date End Date Taking? Authorizing Provider  albuterol (VENTOLIN HFA) 108 (90 Base) MCG/ACT inhaler Inhale 2 puffs into the lungs every 6 (six) hours as needed. 01/20/20  Yes [provider]  betamethasone dipropionate 0.05 % cream Apply 1 Application topically 2 (two) times daily as needed. 08/16/21  Yes [provider]  gabapentin (NEURONTIN) 300 MG capsule Take 300 mg by mouth 2 (two) times daily. 01/15/20  Yes [provider]  naproxen sodium (ALEVE) 220 MG tablet Take 220 mg by mouth.   Yes [provider]  traZODone (DESYREL) 100 MG tablet 1 TABLET BY MOUTH AT BEDTIME ORALLY 10/08/19  Yes [provider]  valsartan-hydrochlorothiazide (DIOVAN-HCT) 160-25 MG tablet Take 1 tablet by mouth daily. 09/14/19  Yes [provider]  ciclopirox (PENLAC) 8 % solution Apply 1 Application topically daily. 08/25/21   [provider]    Family History Family History  Problem Relation Age of Onset   Hypertension Mother    Arthritis Mother    Diabetes Mother    Diabetes Maternal Grandfather    Heart attack Neg Hx    Colon cancer Neg Hx    Prostate cancer Neg Hx    Social History Social History   Tobacco Use   Smoking status: Never   Smokeless tobacco: Never  Substance Use Topics   Alcohol use: Yes  Comment: rare   Drug use: No   Allergies   Penicillins, Aspirin, Cyclobenzaprine, and Latex  Review of Systems Review of Systems Pertinent findings revealed after performing a 14 point review of systems has been noted in the history of present illness.  Physical Exam Vital Signs BP 132/74   Pulse 64   Temp 98.6 F (37 C)   Resp 18   SpO2 99%   No data found.  Physical Exam Vitals and nursing note reviewed.  Constitutional:      General: He is not in acute distress.    Appearance: Normal appearance. He is normal weight. He is not ill-appearing.  HENT:     Head: Normocephalic  and atraumatic.  Eyes:     Extraocular Movements: Extraocular movements intact.     Conjunctiva/sclera: Conjunctivae normal.     Pupils: Pupils are equal, round, and reactive to light.  Cardiovascular:     Rate and Rhythm: Normal rate and regular rhythm.  Pulmonary:     Effort: Pulmonary effort is normal.     Breath sounds: Normal breath sounds.  Abdominal:     General: Abdomen is flat. Bowel sounds are decreased.     Palpations: Abdomen is soft.     Tenderness: There is abdominal tenderness in the right lower quadrant. There is no right CVA tenderness, left CVA tenderness, guarding or rebound. Negative signs include Murphy's sign, Rovsing's sign, McBurney's sign and psoas sign.  Musculoskeletal:        General: Normal range of motion.     Cervical back: Normal range of motion and neck supple.  Skin:    General: Skin is warm and dry.  Neurological:     General: No focal deficit present.     Mental Status: He is alert and oriented to person, place, and time. Mental status is at baseline.  Psychiatric:        Mood and Affect: Mood normal.        Behavior: Behavior normal.        Thought Content: Thought content normal.        Judgment: Judgment normal.     Visual Acuity Right Eye Distance:   Left Eye Distance:   Bilateral Distance:    Right Eye Near:   Left Eye Near:    Bilateral Near:     UC Couse / Diagnostics / Procedures:     Radiology DG Abd 2 Views  Result Date: 08/12/2022 CLINICAL DATA:  Acute onset right abdominal pain EXAM: ABDOMEN - 2 VIEW COMPARISON:  None available FINDINGS: The bowel gas pattern is normal. There is no evidence of free air. No radio-opaque calculi or other significant radiographic abnormality is seen. IMPRESSION: Negative. Electronically Signed   By: Acquanetta Belling M.D.   On: 08/12/2022 11:59    Procedures Procedures (including critical care time) EKG  Pending results:  Labs Reviewed  POCT URINALYSIS DIP (MANUAL ENTRY) - Abnormal; Notable  for the following components:      Result Value   Ketones, POC UA small (15) (*)    Blood, UA trace-intact (*)    All other components within normal limits    Medications Ordered in UC: Medications - No data to display  UC Diagnoses / Final Clinical Impressions(s)   I have reviewed the triage vital signs and the nursing notes.  Pertinent labs & imaging results that were available during my care of the patient were reviewed by me and considered in my medical decision making (see chart for  details).    Final diagnoses:  Right lower quadrant abdominal pain   Patient advised of negative abdominal x-ray findings.  Patient advised that urinalysis is concerning for mild dehydration.  No obvious cause of his abdominal pain is appreciated on physical exam.  Physical exam findings not concerning for acute appendicitis.  Patient advised to the emergency room if pain persists, worsens or if he develops fever, 3D imaging may be indicated.  Please see discharge instructions below for details of plan of care as provided to patient. ED Prescriptions   None    PDMP not reviewed this encounter.  Pending results:  Labs Reviewed  POCT URINALYSIS DIP (MANUAL ENTRY) - Abnormal; Notable for the following components:      Result Value   Ketones, POC UA small (15) (*)    Blood, UA trace-intact (*)    All other components within normal limits    Discharge Instructions:   Discharge Instructions      The x-ray of your abdomen did not reveal any obvious cause for your right lower quadrant pain at this time.  If you continue to feel pain or develop fever please go to the emergency room for further evaluation.  Your urinalysis is consistent with mild dehydration, please sure that you are drinking between 80 and 120 ounces of water daily.  Thank you for visiting [facility] today.        Disposition Upon Discharge:  Condition: stable for discharge home  Patient presented with an  acute illness with associated systemic symptoms and significant discomfort requiring urgent management. In my opinion, this is a condition that a prudent lay person (someone who possesses an average knowledge of health and medicine) may potentially expect to result in complications if not addressed urgently such as respiratory distress, impairment of bodily function or dysfunction of bodily organs.   Routine symptom specific, illness specific and/or disease specific instructions were discussed with the patient and/or caregiver at length.   As such, the patient has been evaluated and assessed, work-up was performed and treatment was provided in alignment with urgent care protocols and evidence based medicine.  Patient/parent/caregiver has been advised that the patient may require follow up for further testing and treatment if the symptoms continue in spite of treatment, as clinically indicated and appropriate.  Patient/parent/caregiver has been advised to return to the [facility] or PCP if no better; to PCP or the Emergency Department if new signs and symptoms develop, or if the current signs or symptoms continue to change or worsen for further workup, evaluation and treatment as clinically indicated and appropriate  The patient will follow up with their current PCP if and as advised. If the patient does not currently have a PCP we will assist them in obtaining one.   The patient may need specialty follow up if the symptoms continue, in spite of conservative treatment and management, for further workup, evaluation, consultation and treatment as clinically indicated and appropriate.  Patient/parent/caregiver verbalized understanding and agreement of plan as discussed.  All questions were addressed during visit.  Please see discharge instructions below for further details of plan.  This office note has been dictated using Teaching laboratory technician.  Unfortunately, this method of dictation can sometimes  lead to typographical or grammatical errors.  I apologize for your inconvenience in advance if this occurs.  Please do not hesitate to reach out to me if clarification is needed.      Theadora Rama Scales, PA-C 08/12/22 1205

## 2022-08-12 NOTE — Discharge Instructions (Addendum)
The x-ray of your abdomen did not reveal any obvious cause for your right lower quadrant pain at this time.  If you continue to feel pain or develop fever please go to the emergency room for further evaluation.  Your urinalysis is consistent with mild dehydration, please sure that you are drinking between 80 and 120 ounces of water daily.  Thank you for visiting [facility] today.

## 2022-08-12 NOTE — ED Triage Notes (Signed)
Patient here POV from Home.  Endorses Right Sided/Mid ABD since 0600. Some Mild Constipation. Mild nausea that has since subsided. No Emesis. No Diarrhea. No Known Fevers or Dysuria.   NAD Noted during Triage. A&Ox4. GCS 15. Ambulatory.

## 2022-08-12 NOTE — H&P (Signed)
Brendan [name] is an 47 y.o. male.   Chief Complaint: RLQ pain HPI: 47yo M with PMhx asthma, HTN, lumbar disc disease C/O lower abdominal pain starting about 6 AM today.  It was associated with some nausea.  It gradually localized more to the right lower quadrant but was crampy and extended towards his epigastrium at times.  He underwent evaluation at [facility] which included CT scan of the abdomen pelvis demonstrating appendicitis.  Accepted him in transfer to [facility] for surgical care.  He reports he had some significant nausea with Dilaudid.  He works as an Art gallery manager at [employer]  Past Medical History:  Diagnosis Date   Asthma    Eczema    GERD (gastroesophageal reflux disease)    Headache    Hypertension    Increased liver enzymes 06/2009   negative hepatitis serology 5-11   Joint pain    Low back pain    Palpitations     Past Surgical History:  Procedure Laterality Date   Biopsy of lump on inner thigh     dx scar tissue    Family History  Problem Relation Age of Onset   Hypertension Mother    Arthritis Mother    Diabetes Mother    Diabetes Maternal Grandfather    Heart attack Neg Hx    Colon cancer Neg Hx    Prostate cancer Neg Hx    Social History:  reports that he has never smoked. He has never used smokeless tobacco. He reports current alcohol use. He reports that he does not use drugs.  Allergies:  Allergies  Allergen Reactions   Penicillins Anaphylaxis   Aspirin    Cyclobenzaprine     Other reaction(s): Rash/[place]   Latex Hives and Itching    Medications Prior to Admission  Medication Sig Dispense Refill   albuterol (VENTOLIN HFA) 108 (90 Base) MCG/ACT inhaler Inhale 2 puffs into the lungs every 6 (six) hours as needed.     betamethasone dipropionate 0.05 % cream Apply 1 Application topically 2 (two) times daily as needed.     gabapentin (NEURONTIN) 300 MG capsule Take 300 mg by mouth 2 (two) times daily.     naproxen sodium (ALEVE)  220 MG tablet Take 220 mg by mouth.     traZODone (DESYREL) 100 MG tablet 1 TABLET BY MOUTH AT BEDTIME ORALLY     valsartan-hydrochlorothiazide (DIOVAN-HCT) 160-25 MG tablet Take 1 tablet by mouth daily.      Results for orders placed or performed during the hospital encounter of 08/12/22 (from the past 48 hour(s))  Lipase, blood     Status: None   Collection Time: 08/12/22 12:54 PM  Result Value Ref Range   Lipase 24 11 - 51 U/L    Comment: Performed at [facility], [address], [place], [place] [address]  Comprehensive metabolic panel     Status: Abnormal   Collection Time: 08/12/22 12:54 PM  Result Value Ref Range   Sodium 134 (L) 135 - 145 mmol/L   Potassium 3.5 3.5 - 5.1 mmol/L   Chloride 99 98 - 111 mmol/L   CO2 24 22 - 32 mmol/L   Glucose, Bld 116 (H) 70 - 99 mg/dL    Comment: Glucose reference range applies only to samples taken after fasting for at least 8 hours.   BUN 17 6 - 20 mg/dL   Creatinine, Ser 6.04 0.61 - 1.24 mg/dL   Calcium 9.8 8.9 - 54.0 mg/dL   Total  Protein 7.6 6.5 - 8.1 g/dL   Albumin 5.0 3.5 - 5.0 g/dL   AST 30 15 - 41 U/L   ALT 53 (H) 0 - 44 U/L   Alkaline Phosphatase 51 38 - 126 U/L   Total Bilirubin 1.9 (H) 0.3 - 1.2 mg/dL   GFR, Estimated >16 >10 mL/min    Comment: (NOTE) Calculated using the CKD-EPI Creatinine Equation (2021)    Anion gap 11 5 - 15    Comment: Performed at [facility], [address] [place], [place] [address]  CBC     Status: Abnormal   Collection Time: 08/12/22 12:54 PM  Result Value Ref Range   WBC 13.1 (H) 4.0 - 10.5 K/uL   RBC 4.79 4.22 - 5.81 MIL/uL   Hemoglobin 15.3 13.0 - 17.0 g/dL   HCT 40.9 81.1 - 91.4 %   MCV 89.8 80.0 - 100.0 fL   MCH 31.9 26.0 - 34.0 pg   MCHC 35.6 30.0 - 36.0 g/dL   RDW 78.2 95.6 - 21.3 %   Platelets 200 150 - 400 K/uL   nRBC 0.0 0.0 - 0.2 %    Comment: Performed at [facility], [address] [place], [place] [address]   Urinalysis, Routine w reflex microscopic -Urine, Clean Catch     Status: Abnormal   Collection Time: 08/12/22 12:54 PM  Result Value Ref Range   Color, Urine YELLOW YELLOW   APPearance CLEAR CLEAR   Specific Gravity, Urine 1.014 1.005 - 1.030   pH 7.0 5.0 - 8.0   Glucose, UA NEGATIVE NEGATIVE mg/dL   Hgb urine dipstick NEGATIVE NEGATIVE   Bilirubin Urine NEGATIVE NEGATIVE   Ketones, ur 40 (A) NEGATIVE mg/dL   Protein, ur NEGATIVE NEGATIVE mg/dL   Nitrite NEGATIVE NEGATIVE   Leukocytes,Ua NEGATIVE NEGATIVE    Comment: Performed at [facility], [address], [place], [place] [address]   CT ABDOMEN PELVIS W CONTRAST  Result Date: 08/12/2022 CLINICAL DATA:  RLQ abdominal pain clinical concern for appendicitis. EXAM: CT ABDOMEN AND PELVIS WITH CONTRAST TECHNIQUE: Multidetector CT imaging of the abdomen and pelvis was performed using the standard protocol following bolus administration of intravenous contrast. RADIATION DOSE REDUCTION: This exam was performed according to the departmental dose-optimization program which includes automated exposure control, adjustment of the mA and/or kV according to patient size and/or use of iterative reconstruction technique. CONTRAST:  85mL OMNIPAQUE IOHEXOL 300 MG/ML  SOLN COMPARISON:  None Available. FINDINGS: Lower chest: No acute abnormality. Extensive coronary artery calcifications. Hepatobiliary: Hepatic steatosis. Layering hyperdensity in the gallbladder (axial image 9 series 2), likely stones or sludge. No surrounding inflammation or wall thickening to suggest acute cholecystitis. No biliary dilatation. Pancreas: Unremarkable. No pancreatic ductal dilatation or surrounding inflammatory changes. Spleen: Normal. Adrenals/Urinary Tract: Adrenal glands are unremarkable. Kidneys are normal, without renal calculi, focal lesion, or hydronephrosis. Bladder is unremarkable. Stomach/Bowel: Normal stomach and duodenum. No dilated loops of small  bowel. Mildly dilated, fluid-filled appendix, measuring up to 10 mm in diameter (coronal image 67 series 5), with adjacent periappendiceal fluid and mild surrounding fat stranding (coronal image 68 series 5), suspicious for acute appendicitis. Colon is unremarkable. Vascular/Lymphatic: No significant vascular findings are present. No enlarged abdominal or pelvic lymph nodes. Reproductive: Prostate is unremarkable. Other: No abdominal wall hernia or abnormality. No abdominopelvic ascites. Musculoskeletal: No acute or significant osseous findings. IMPRESSION: Findings suspicious for acute, uncomplicated appendicitis. Electronically Signed   By: Orvan Falconer M.D.   On: 08/12/2022 16:24  DG Abd 2 Views  Result Date: 08/12/2022 CLINICAL DATA:  Acute onset right abdominal pain EXAM: ABDOMEN - 2 VIEW COMPARISON:  None available FINDINGS: The bowel gas pattern is normal. There is no evidence of free air. No radio-opaque calculi or other significant radiographic abnormality is seen. IMPRESSION: Negative. Electronically Signed   By: Acquanetta Belling M.D.   On: 08/12/2022 11:59    Review of Systems  Constitutional:  Positive for appetite change.  HENT: Negative.    Eyes: Negative.   Respiratory: Negative.    Cardiovascular: Negative.   Gastrointestinal:  Positive for abdominal pain and nausea. Negative for diarrhea and vomiting.  Endocrine: Negative.   Genitourinary: Negative.   Musculoskeletal:  Positive for back pain.  Allergic/Immunologic: Negative.   Neurological: Negative.   Hematological: Negative.   Psychiatric/Behavioral: Negative.      Blood pressure 130/72, pulse 60, temperature 98.3 F (36.8 C), temperature source Oral, resp. rate 16, height 6' (1.829 m), weight 117.9 kg, SpO2 98 %. Physical Exam Constitutional:      Appearance: He is well-developed. He is not ill-appearing.  Cardiovascular:     Rate and Rhythm: Normal rate and regular rhythm.  Pulmonary:     Effort: Pulmonary effort is  normal.     Breath sounds: Normal breath sounds. No wheezing.  Abdominal:     General: There is no distension.     Palpations: Abdomen is soft.     Tenderness: There is abdominal tenderness in the right lower quadrant. There is no guarding or rebound.     Hernia: No hernia is present.  Skin:    General: Skin is warm.     Capillary Refill: Capillary refill takes less than 2 seconds.  Neurological:     Mental Status: He is alert and oriented to person, place, and time.  Psychiatric:        Mood and Affect: Mood normal.      Assessment/Plan Appendicitis -IV Cipro and Flagyl due to significant penicillin allergy.  Will plan laparoscopic appendectomy by Dr. Luisa Hart.  I discussed procedure, risks, and benefits.  Additionally, I discussed the expected postoperative course.  He is agreeable. Hypertension -Home med Asthma -Home inhaler as needed  Admit to observation I also spoke with his wife. Liz Malady, MD 08/12/2022, 10:11 PM

## 2022-08-12 NOTE — Progress Notes (Signed)
Patient arrived to unit from [facility]. He is AAOX4. Vital signs are stable and call bell placed within reach.  MD notified of patients arrival to 6N.

## 2022-08-12 NOTE — ED Triage Notes (Signed)
Pt has had Rt sided ABD pain since 0600 today

## 2022-08-13 ENCOUNTER — Observation Stay (HOSPITAL_COMMUNITY): Payer: Commercial Managed Care - PPO

## 2022-08-13 ENCOUNTER — Observation Stay (HOSPITAL_BASED_OUTPATIENT_CLINIC_OR_DEPARTMENT_OTHER): Payer: Commercial Managed Care - PPO

## 2022-08-13 ENCOUNTER — Encounter (HOSPITAL_COMMUNITY): Payer: Self-pay | Admitting: General Surgery

## 2022-08-13 ENCOUNTER — Other Ambulatory Visit: Payer: Self-pay

## 2022-08-13 ENCOUNTER — Encounter (HOSPITAL_COMMUNITY): Admission: EM | Disposition: A | Discharge status: Home / Self Care | Payer: Self-pay | Source: Home / Self Care

## 2022-08-13 DIAGNOSIS — K3580 Unspecified acute appendicitis: Secondary | ICD-10-CM | POA: Diagnosis not present

## 2022-08-13 HISTORY — PX: LAPAROSCOPIC APPENDECTOMY: SHX408

## 2022-08-13 LAB — CBC
HCT: 40.5 % (ref 39.0–52.0)
Hemoglobin: 14 g/dL (ref 13.0–17.0)
MCH: 31.5 pg (ref 26.0–34.0)
MCHC: 34.6 g/dL (ref 30.0–36.0)
MCV: 91.2 fL (ref 80.0–100.0)
Platelets: 175 10*3/uL (ref 150–400)
RBC: 4.44 MIL/uL (ref 4.22–5.81)
RDW: 13 % (ref 11.5–15.5)
WBC: 11.1 10*3/uL — ABNORMAL HIGH (ref 4.0–10.5)
nRBC: 0 % (ref 0.0–0.2)

## 2022-08-13 LAB — BASIC METABOLIC PANEL
Anion gap: 8 (ref 5–15)
BUN: 9 mg/dL (ref 6–20)
CO2: 23 mmol/L (ref 22–32)
Calcium: 8.8 mg/dL — ABNORMAL LOW (ref 8.9–10.3)
Chloride: 101 mmol/L (ref 98–111)
Creatinine, Ser: 0.92 mg/dL (ref 0.61–1.24)
GFR, Estimated: >60 mL/min (ref >60)
Glucose, Bld: 107 mg/dL — ABNORMAL HIGH (ref 70–99)
Potassium: 3.3 mmol/L — ABNORMAL LOW (ref 3.5–5.1)
Sodium: 132 mmol/L — ABNORMAL LOW (ref 135–145)

## 2022-08-13 LAB — SURGICAL PCR SCREEN
MRSA, PCR: NEGATIVE
Staphylococcus aureus: POSITIVE — AB

## 2022-08-13 LAB — HIV ANTIBODY (ROUTINE TESTING W REFLEX): HIV Screen 4th Generation wRfx: NONREACTIVE

## 2022-08-13 SURGERY — APPENDECTOMY, LAPAROSCOPIC
Anesthesia: General | Site: Abdomen

## 2022-08-13 MED ORDER — SUCCINYLCHOLINE CHLORIDE 200 MG/10ML IV SOSY
PREFILLED_SYRINGE | INTRAVENOUS | Status: AC
  Filled 2022-08-13: qty 20

## 2022-08-13 MED ORDER — LACTATED RINGERS IV SOLN: INTRAVENOUS | Status: DC

## 2022-08-13 MED ORDER — 0.9 % SODIUM CHLORIDE (POUR BTL) OPTIME
TOPICAL | Status: DC | PRN
  Administered 2022-08-13: 1000 mL

## 2022-08-13 MED ORDER — KETOROLAC TROMETHAMINE 30 MG/ML IJ SOLN
INTRAMUSCULAR | Status: DC | PRN
  Administered 2022-08-13: 30 mg via INTRAVENOUS

## 2022-08-13 MED ORDER — EPHEDRINE SULFATE-NACL 50-0.9 MG/10ML-% IV SOSY
PREFILLED_SYRINGE | INTRAVENOUS | Status: DC | PRN
  Administered 2022-08-13: 10 mg via INTRAVENOUS

## 2022-08-13 MED ORDER — CHLORHEXIDINE GLUCONATE 0.12 % MT SOLN
15.0000 mL | Freq: Once | OROMUCOSAL | Status: AC
  Administered 2022-08-13: 15 mL via OROMUCOSAL

## 2022-08-13 MED ORDER — FENTANYL CITRATE (PF) 100 MCG/2ML IJ SOLN
INTRAMUSCULAR | Status: AC
  Filled 2022-08-13: qty 2

## 2022-08-13 MED ORDER — SODIUM CHLORIDE 0.9 % IR SOLN
Status: DC | PRN
  Administered 2022-08-13: 1000 mL

## 2022-08-13 MED ORDER — MIDAZOLAM HCL 2 MG/2ML IJ SOLN
INTRAMUSCULAR | Status: DC | PRN
  Administered 2022-08-13: 2 mg via INTRAVENOUS

## 2022-08-13 MED ORDER — LIDOCAINE 2% (20 MG/ML) 5 ML SYRINGE
INTRAMUSCULAR | Status: AC
  Filled 2022-08-13: qty 5

## 2022-08-13 MED ORDER — DEXAMETHASONE SODIUM PHOSPHATE 10 MG/ML IJ SOLN
INTRAMUSCULAR | Status: AC
  Filled 2022-08-13: qty 1

## 2022-08-13 MED ORDER — FENTANYL CITRATE (PF) 250 MCG/5ML IJ SOLN
INTRAMUSCULAR | Status: DC | PRN
  Administered 2022-08-13 (×4): 50 ug via INTRAVENOUS

## 2022-08-13 MED ORDER — PHENYLEPHRINE 80 MCG/ML (10ML) SYRINGE FOR IV PUSH (FOR BLOOD PRESSURE SUPPORT)
PREFILLED_SYRINGE | INTRAVENOUS | Status: AC
  Filled 2022-08-13: qty 30

## 2022-08-13 MED ORDER — CHLORHEXIDINE GLUCONATE 0.12 % MT SOLN
OROMUCOSAL | Status: AC
  Filled 2022-08-13: qty 15

## 2022-08-13 MED ORDER — LIDOCAINE 2% (20 MG/ML) 5 ML SYRINGE
INTRAMUSCULAR | Status: DC | PRN
  Administered 2022-08-13: 60 mg via INTRAVENOUS

## 2022-08-13 MED ORDER — BUPIVACAINE-EPINEPHRINE 0.25% -1:200000 IJ SOLN
INTRAMUSCULAR | Status: DC | PRN
  Administered 2022-08-13: 5 mL

## 2022-08-13 MED ORDER — MIDAZOLAM HCL 2 MG/2ML IJ SOLN
INTRAMUSCULAR | Status: AC
  Filled 2022-08-13: qty 2

## 2022-08-13 MED ORDER — PROPOFOL 10 MG/ML IV BOLUS
INTRAVENOUS | Status: DC | PRN
  Administered 2022-08-13: 150 mg via INTRAVENOUS

## 2022-08-13 MED ORDER — ROCURONIUM BROMIDE 10 MG/ML (PF) SYRINGE
PREFILLED_SYRINGE | INTRAVENOUS | Status: DC | PRN
  Administered 2022-08-13: 60 mg via INTRAVENOUS
  Administered 2022-08-13: 20 mg via INTRAVENOUS

## 2022-08-13 MED ORDER — SUGAMMADEX SODIUM 200 MG/2ML IV SOLN
INTRAVENOUS | Status: DC | PRN
  Administered 2022-08-13: 200 mg via INTRAVENOUS

## 2022-08-13 MED ORDER — ONDANSETRON HCL 4 MG/2ML IJ SOLN
INTRAMUSCULAR | Status: AC
  Filled 2022-08-13: qty 2

## 2022-08-13 MED ORDER — OXYCODONE HCL 5 MG PO TABS
10.0000 mg | ORAL_TABLET | Freq: Four times a day (QID) | ORAL | Status: DC | PRN
  Administered 2022-08-13 – 2022-08-14 (×2): 10 mg via ORAL
  Filled 2022-08-13 (×2): qty 2

## 2022-08-13 MED ORDER — ROCURONIUM BROMIDE 10 MG/ML (PF) SYRINGE
PREFILLED_SYRINGE | INTRAVENOUS | Status: AC
  Filled 2022-08-13: qty 10

## 2022-08-13 MED ORDER — FENTANYL CITRATE (PF) 250 MCG/5ML IJ SOLN
INTRAMUSCULAR | Status: AC
  Filled 2022-08-13: qty 5

## 2022-08-13 MED ORDER — ACETAMINOPHEN 10 MG/ML IV SOLN
INTRAVENOUS | Status: DC | PRN
  Administered 2022-08-13: 1000 mg via INTRAVENOUS

## 2022-08-13 MED ORDER — PHENYLEPHRINE 80 MCG/ML (10ML) SYRINGE FOR IV PUSH (FOR BLOOD PRESSURE SUPPORT)
PREFILLED_SYRINGE | INTRAVENOUS | Status: DC | PRN
  Administered 2022-08-13 (×3): 80 ug via INTRAVENOUS

## 2022-08-13 MED ORDER — BUPIVACAINE-EPINEPHRINE (PF) 0.25% -1:200000 IJ SOLN
INTRAMUSCULAR | Status: AC
  Filled 2022-08-13: qty 30

## 2022-08-13 MED ORDER — FENTANYL CITRATE (PF) 100 MCG/2ML IJ SOLN
25.0000 ug | INTRAMUSCULAR | Status: DC | PRN
  Administered 2022-08-13 (×2): 50 ug via INTRAVENOUS

## 2022-08-13 MED ORDER — OXYCODONE HCL 5 MG PO TABS
5.0000 mg | ORAL_TABLET | ORAL | Status: DC | PRN
  Filled 2022-08-13: qty 1

## 2022-08-13 MED ORDER — ORAL CARE MOUTH RINSE: 15.0000 mL | Freq: Once | OROMUCOSAL | Status: AC

## 2022-08-13 MED ORDER — MUPIROCIN 2 % EX OINT
1.0000 | TOPICAL_OINTMENT | Freq: Two times a day (BID) | CUTANEOUS | Status: DC
  Administered 2022-08-13 – 2022-08-14 (×3): 1 via NASAL
  Filled 2022-08-13: qty 22

## 2022-08-13 MED ORDER — PROPOFOL 10 MG/ML IV BOLUS
INTRAVENOUS | Status: AC
  Filled 2022-08-13: qty 20

## 2022-08-13 MED ORDER — EPHEDRINE 5 MG/ML INJ
INTRAVENOUS | Status: AC
  Filled 2022-08-13: qty 5

## 2022-08-13 MED ORDER — ACETAMINOPHEN 10 MG/ML IV SOLN
INTRAVENOUS | Status: AC
  Filled 2022-08-13: qty 100

## 2022-08-13 MED ORDER — DEXAMETHASONE SODIUM PHOSPHATE 10 MG/ML IJ SOLN
INTRAMUSCULAR | Status: DC | PRN
  Administered 2022-08-13: 5 mg via INTRAVENOUS

## 2022-08-13 MED ORDER — ROCURONIUM BROMIDE 10 MG/ML (PF) SYRINGE
PREFILLED_SYRINGE | INTRAVENOUS | Status: AC
  Filled 2022-08-13: qty 20

## 2022-08-13 SURGICAL SUPPLY — 51 items
ADH SKN CLS APL DERMABOND .7 (GAUZE/BANDAGES/DRESSINGS) ×1
APL PRP STRL LF DISP 70% ISPRP (MISCELLANEOUS) ×1
APPLIER CLIP ROT 10 11.4 M/L (STAPLE)
APR CLP MED LRG 11.4X10 (STAPLE)
BAG COUNTER SPONGE SURGICOUNT (BAG) ×1 IMPLANT
BAG SPEC RTRVL 10 TROC 200 (ENDOMECHANICALS) ×1
BAG SPNG CNTER NS LX DISP (BAG) ×1
BLADE CLIPPER SURG (BLADE) IMPLANT
CANISTER SUCT 3000ML PPV (MISCELLANEOUS) ×1 IMPLANT
CHLORAPREP W/TINT 26 (MISCELLANEOUS) ×1 IMPLANT
CLIP APPLIE ROT 10 11.4 M/L (STAPLE) IMPLANT
COVER SURGICAL LIGHT HANDLE (MISCELLANEOUS) ×1 IMPLANT
CUTTER FLEX LINEAR 45M (STAPLE) ×1 IMPLANT
DERMABOND ADVANCED .7 DNX12 (GAUZE/BANDAGES/DRESSINGS) ×1 IMPLANT
ELECT REM PT RETURN 9FT ADLT (ELECTROSURGICAL) ×1
ELECTRODE REM PT RTRN 9FT ADLT (ELECTROSURGICAL) ×1 IMPLANT
ENDOLOOP SUT PDS II 0 18 (SUTURE) IMPLANT
GLOVE BIO SURGEON STRL SZ8 (GLOVE) ×1 IMPLANT
GLOVE BIOGEL PI IND STRL 7.0 (GLOVE) IMPLANT
GLOVE BIOGEL PI IND STRL 8 (GLOVE) ×1 IMPLANT
GOWN STRL REUS W/ TWL LRG LVL3 (GOWN DISPOSABLE) ×2 IMPLANT
GOWN STRL REUS W/ TWL XL LVL3 (GOWN DISPOSABLE) ×1 IMPLANT
GOWN STRL REUS W/TWL LRG LVL3 (GOWN DISPOSABLE) ×2
GOWN STRL REUS W/TWL XL LVL3 (GOWN DISPOSABLE) ×1
IRRIG SUCT STRYKERFLOW 2 WTIP (MISCELLANEOUS) ×1
IRRIGATION SUCT STRKRFLW 2 WTP (MISCELLANEOUS) ×1 IMPLANT
KIT BASIN OR (CUSTOM PROCEDURE TRAY) ×1 IMPLANT
KIT TURNOVER KIT B (KITS) ×1 IMPLANT
NS IRRIG 1000ML POUR BTL (IV SOLUTION) ×1 IMPLANT
PAD ARMBOARD 7.5X6 YLW CONV (MISCELLANEOUS) ×2 IMPLANT
POUCH RETRIEVAL ECOSAC 10 (ENDOMECHANICALS) ×1 IMPLANT
RELOAD STAPLE 45 3.5 BLU ETS (ENDOMECHANICALS) ×1 IMPLANT
RELOAD STAPLE TA45 3.5 REG BLU (ENDOMECHANICALS) ×1 IMPLANT
SCISSORS LAP 5X35 DISP (ENDOMECHANICALS) ×1 IMPLANT
SET TUBE SMOKE EVAC HIGH FLOW (TUBING) ×1 IMPLANT
SHEARS HARMONIC ACE PLUS 36CM (ENDOMECHANICALS) ×1 IMPLANT
SPECIMEN JAR SMALL (MISCELLANEOUS) ×1 IMPLANT
SUT MON AB 3-0 SH 27 (SUTURE) ×1
SUT MON AB 3-0 SH27 (SUTURE) IMPLANT
SUT MON AB 4-0 PC3 18 (SUTURE) ×1 IMPLANT
TOWEL GREEN STERILE (TOWEL DISPOSABLE) ×1 IMPLANT
TOWEL GREEN STERILE FF (TOWEL DISPOSABLE) ×1 IMPLANT
TRAY FOL W/BAG SLVR 16FR STRL (SET/KITS/TRAYS/PACK) IMPLANT
TRAY FOLEY W/BAG SLVR 16FR (SET/KITS/TRAYS/PACK)
TRAY FOLEY W/BAG SLVR 16FR LF (SET/KITS/TRAYS/PACK) ×1
TRAY FOLEY W/BAG SLVR 16FR ST (SET/KITS/TRAYS/PACK) ×1 IMPLANT
TRAY LAPAROSCOPIC MC (CUSTOM PROCEDURE TRAY) ×1 IMPLANT
TROCAR BALLN 12MMX100 BLUNT (TROCAR) ×1 IMPLANT
TROCAR XCEL BLADELESS 5X75MML (TROCAR) ×2 IMPLANT
WARMER LAPAROSCOPE (MISCELLANEOUS) ×1 IMPLANT
WATER STERILE IRR 1000ML POUR (IV SOLUTION) ×1 IMPLANT

## 2022-08-13 NOTE — Interval H&P Note (Signed)
History and Physical Interval Note:  08/13/2022 10:27 AM  Brendan [name]  has presented today for surgery, with the diagnosis of appendicitis.  The various methods of treatment have been discussed with the patient and family. After consideration of risks, benefits and other options for treatment, the patient has consented to  Procedure(s): APPENDECTOMY LAPAROSCOPIC (N/A) as a surgical intervention.  The patient's history has been reviewed, patient examined, no change in status, stable for surgery.  I have reviewed the patient's chart and labs.  Questions were answered to the patient's satisfaction.     Brendan [name] A Vasiliki Smaldone

## 2022-08-13 NOTE — Progress Notes (Signed)
Pt transported to short stay via bed by transportation staff 

## 2022-08-13 NOTE — Progress Notes (Signed)
Subjective/Chief Complaint: Patient states right lower quadrant pain about the same   Objective: Vital signs in last 24 hours: Temp:  [97.9 F (36.6 C)-98.9 F (37.2 C)] 98.5 F (36.9 C) (07/10 0405) Pulse Rate:  [59-69] 61 (07/10 0405) Resp:  [16-18] 18 (07/10 0405) BP: (104-132)/(53-85) 104/53 (07/10 0405) SpO2:  [94 %-99 %] 97 % (07/10 0405) Weight:  [117.9 kg] 117.9 kg (07/09 1252) Last BM Date : 08/12/22  Intake/Output from previous day: 07/09 0701 - 07/10 0700 In: 919.8 [I.V.:331; IV Piggyback:588.9] Out: -  Intake/Output this shift: No intake/output data recorded.  Abdomen: Tender lower quadrant guarding  Lab Results:  Recent Labs    08/12/22 1254 08/13/22 0254  WBC 13.1* 11.1*  HGB 15.3 14.0  HCT 43.0 40.5  PLT 200 175   BMET Recent Labs    08/12/22 1254 08/13/22 0254  NA 134* 132*  K 3.5 3.3*  CL 99 101  CO2 24 23  GLUCOSE 116* 107*  BUN 17 9  CREATININE 0.84 0.92  CALCIUM 9.8 8.8*   PT/INR No results for input(s): "LABPROT", "INR" in the last 72 hours. ABG No results for input(s): "PHART", "HCO3" in the last 72 hours.  Invalid input(s): "PCO2", "PO2"  Studies/Results: CT ABDOMEN PELVIS W CONTRAST  Result Date: 08/12/2022 CLINICAL DATA:  RLQ abdominal pain clinical concern for appendicitis. EXAM: CT ABDOMEN AND PELVIS WITH CONTRAST TECHNIQUE: Multidetector CT imaging of the abdomen and pelvis was performed using the standard protocol following bolus administration of intravenous contrast. RADIATION DOSE REDUCTION: This exam was performed according to the departmental dose-optimization program which includes automated exposure control, adjustment of the mA and/or kV according to patient size and/or use of iterative reconstruction technique. CONTRAST:  85mL OMNIPAQUE IOHEXOL 300 MG/ML  SOLN COMPARISON:  None Available. FINDINGS: Lower chest: No acute abnormality. Extensive coronary artery calcifications. Hepatobiliary: Hepatic steatosis.  Layering hyperdensity in the gallbladder (axial image 9 series 2), likely stones or sludge. No surrounding inflammation or wall thickening to suggest acute cholecystitis. No biliary dilatation. Pancreas: Unremarkable. No pancreatic ductal dilatation or surrounding inflammatory changes. Spleen: Normal. Adrenals/Urinary Tract: Adrenal glands are unremarkable. Kidneys are normal, without renal calculi, focal lesion, or hydronephrosis. Bladder is unremarkable. Stomach/Bowel: Normal stomach and duodenum. No dilated loops of small bowel. Mildly dilated, fluid-filled appendix, measuring up to 10 mm in diameter (coronal image 67 series 5), with adjacent periappendiceal fluid and mild surrounding fat stranding (coronal image 68 series 5), suspicious for acute appendicitis. Colon is unremarkable. Vascular/Lymphatic: No significant vascular findings are present. No enlarged abdominal or pelvic lymph nodes. Reproductive: Prostate is unremarkable. Other: No abdominal wall hernia or abnormality. No abdominopelvic ascites. Musculoskeletal: No acute or significant osseous findings. IMPRESSION: Findings suspicious for acute, uncomplicated appendicitis. Electronically Signed   By: Orvan Falconer M.D.   On: 08/12/2022 16:24   DG Abd 2 Views  Result Date: 08/12/2022 CLINICAL DATA:  Acute onset right abdominal pain EXAM: ABDOMEN - 2 VIEW COMPARISON:  None available FINDINGS: The bowel gas pattern is normal. There is no evidence of free air. No radio-opaque calculi or other significant radiographic abnormality is seen. IMPRESSION: Negative. Electronically Signed   By: Acquanetta Belling M.D.   On: 08/12/2022 11:59    Anti-infectives: Anti-infectives (From admission, onward)    Start     Dose/Rate Route Frequency Ordered Stop   08/13/22 0500  ciprofloxacin (CIPRO) IVPB 400 mg       See Hyperspace for full Linked Orders Report.   400 mg 200 mL/hr  over 60 Minutes Intravenous Every 12 hours 08/12/22 2326 08/20/22 0459   08/13/22 0500   metroNIDAZOLE (FLAGYL) IVPB 500 mg       See Hyperspace for full Linked Orders Report.   500 mg 100 mL/hr over 60 Minutes Intravenous Every 12 hours 08/12/22 2326 08/20/22 0459   08/12/22 1645  ciprofloxacin (CIPRO) IVPB 400 mg       See Hyperspace for full Linked Orders Report.   400 mg 200 mL/hr over 60 Minutes Intravenous  Once 08/12/22 1638 08/12/22 1826   08/12/22 1645  metroNIDAZOLE (FLAGYL) IVPB 500 mg       See Hyperspace for full Linked Orders Report.   500 mg 100 mL/hr over 60 Minutes Intravenous  Once 08/12/22 1638 08/12/22 1826       Assessment/Plan: Appendicitis  Laparoscopic appendectomy today   The procedure has been discussed with the patient.  Alternative therapies have been discussed with the patient.  Operative risks include bleeding,  Infection,  Organ injury,  Nerve injury,  Blood vessel injury,  DVT,  Pulmonary embolism,  Death,  And possible reoperation.  Medical management risks include worsening of present situation.  The success of the procedure is 50 -90 % at treating patients symptoms.  The patient understands and agrees to proceed.    LOS: 1 day    Dortha Schwalbe 08/13/2022 Straightforward

## 2022-08-13 NOTE — Plan of Care (Signed)
  Problem: Pain Managment: Goal: General experience of comfort will improve Outcome: Progressing   

## 2022-08-13 NOTE — Progress Notes (Signed)
   08/13/22 1543  TOC Brief Assessment  Insurance and Status Reviewed  Patient has primary care physician Yes  Home environment has been reviewed home w spouse  Prior level of function: independent  Prior/Current Home Services No current home services  Social Determinants of Health Reivew SDOH reviewed no interventions necessary  Readmission risk has been reviewed Yes  Transition of care needs no transition of care needs at this time

## 2022-08-13 NOTE — Discharge Instructions (Signed)

## 2022-08-13 NOTE — Progress Notes (Signed)
Report called to short stay. Nurse verbalized understanding of report and had no further questions. All undergarments removed. Glasses left with pts wife. Consent signed. Flowsheet filled out. CHG wipes done.

## 2022-08-13 NOTE — Anesthesia Procedure Notes (Signed)
Procedure Name: Intubation Date/Time: 08/13/2022 11:23 AM  Performed by: Carolynne Edouard, RNPre-anesthesia Checklist: Patient identified, Emergency Drugs available, Suction available and Patient being monitored Patient Re-evaluated:Patient Re-evaluated prior to induction Oxygen Delivery Method: Circle system utilized Preoxygenation: Pre-oxygenation with 100% oxygen Induction Type: IV induction Ventilation: Mask ventilation without difficulty Laryngoscope Size: Mac and 4 Grade View: Grade I Tube type: Oral Tube size: 7.5 mm Number of attempts: 1 Airway Equipment and Method: Stylet and Oral airway Placement Confirmation: ETT inserted through vocal cords under direct vision, positive ETCO2 and breath sounds checked- equal and bilateral Secured at: 23 cm Tube secured with: Tape Dental Injury: Teeth and Oropharynx as per pre-operative assessment

## 2022-08-13 NOTE — Transfer of Care (Signed)
Immediate Anesthesia Transfer of Care Note  Patient: Brendan [name]  Procedure(s) Performed: APPENDECTOMY LAPAROSCOPIC (Abdomen)  Patient Location: PACU  Anesthesia Type:General  Level of Consciousness: awake and responds to stimulation  Airway & Oxygen Therapy: Patient Spontanous Breathing and Patient connected to nasal cannula oxygen  Post-op Assessment: Report given to RN and Post -op Vital signs reviewed and stable  Post vital signs: Reviewed and stable  Last Vitals:  Vitals Value Taken Time  BP 128/62 08/13/22 1230  Temp    Pulse 75 08/13/22 1232  Resp 15 08/13/22 1232  SpO2 98 % 08/13/22 1232  Vitals shown include unvalidated device data.  Last Pain:  Vitals:   08/13/22 0910  TempSrc:   PainSc: 7       Patients Stated Pain Goal: 3 (08/13/22 0910)  Complications: No notable events documented.

## 2022-08-13 NOTE — H&P (View-Only) (Signed)
 Subjective/Chief Complaint: Patient states right lower quadrant pain about the same   Objective: Vital signs in last 24 hours: Temp:  [97.9 F (36.6 C)-98.9 F (37.2 C)] 98.5 F (36.9 C) (07/10 0405) Pulse Rate:  [59-69] 61 (07/10 0405) Resp:  [16-18] 18 (07/10 0405) BP: (104-132)/(53-85) 104/53 (07/10 0405) SpO2:  [94 %-99 %] 97 % (07/10 0405) Weight:  [117.9 kg] 117.9 kg (07/09 1252) Last BM Date : 08/12/22  Intake/Output from previous day: 07/09 0701 - 07/10 0700 In: 919.8 [I.V.:331; IV Piggyback:588.9] Out: -  Intake/Output this shift: No intake/output data recorded.  Abdomen: Tender lower quadrant guarding  Lab Results:  Recent Labs    08/12/22 1254 08/13/22 0254  WBC 13.1* 11.1*  HGB 15.3 14.0  HCT 43.0 40.5  PLT 200 175   BMET Recent Labs    08/12/22 1254 08/13/22 0254  NA 134* 132*  K 3.5 3.3*  CL 99 101  CO2 24 23  GLUCOSE 116* 107*  BUN 17 9  CREATININE 0.84 0.92  CALCIUM 9.8 8.8*   PT/INR No results for input(s): "LABPROT", "INR" in the last 72 hours. ABG No results for input(s): "PHART", "HCO3" in the last 72 hours.  Invalid input(s): "PCO2", "PO2"  Studies/Results: CT ABDOMEN PELVIS W CONTRAST  Result Date: 08/12/2022 CLINICAL DATA:  RLQ abdominal pain clinical concern for appendicitis. EXAM: CT ABDOMEN AND PELVIS WITH CONTRAST TECHNIQUE: Multidetector CT imaging of the abdomen and pelvis was performed using the standard protocol following bolus administration of intravenous contrast. RADIATION DOSE REDUCTION: This exam was performed according to the departmental dose-optimization program which includes automated exposure control, adjustment of the mA and/or kV according to patient size and/or use of iterative reconstruction technique. CONTRAST:  85mL OMNIPAQUE IOHEXOL 300 MG/ML  SOLN COMPARISON:  None Available. FINDINGS: Lower chest: No acute abnormality. Extensive coronary artery calcifications. Hepatobiliary: Hepatic steatosis.  Layering hyperdensity in the gallbladder (axial image 9 series 2), likely stones or sludge. No surrounding inflammation or wall thickening to suggest acute cholecystitis. No biliary dilatation. Pancreas: Unremarkable. No pancreatic ductal dilatation or surrounding inflammatory changes. Spleen: Normal. Adrenals/Urinary Tract: Adrenal glands are unremarkable. Kidneys are normal, without renal calculi, focal lesion, or hydronephrosis. Bladder is unremarkable. Stomach/Bowel: Normal stomach and duodenum. No dilated loops of small bowel. Mildly dilated, fluid-filled appendix, measuring up to 10 mm in diameter (coronal image 67 series 5), with adjacent periappendiceal fluid and mild surrounding fat stranding (coronal image 68 series 5), suspicious for acute appendicitis. Colon is unremarkable. Vascular/Lymphatic: No significant vascular findings are present. No enlarged abdominal or pelvic lymph nodes. Reproductive: Prostate is unremarkable. Other: No abdominal wall hernia or abnormality. No abdominopelvic ascites. Musculoskeletal: No acute or significant osseous findings. IMPRESSION: Findings suspicious for acute, uncomplicated appendicitis. Electronically Signed   By: Walter  Wiggins M.D.   On: 08/12/2022 16:24   DG Abd 2 Views  Result Date: 08/12/2022 CLINICAL DATA:  Acute onset right abdominal pain EXAM: ABDOMEN - 2 VIEW COMPARISON:  None available FINDINGS: The bowel gas pattern is normal. There is no evidence of free air. No radio-opaque calculi or other significant radiographic abnormality is seen. IMPRESSION: Negative. Electronically Signed   By: Farhaan  Mir M.D.   On: 08/12/2022 11:59    Anti-infectives: Anti-infectives (From admission, onward)    Start     Dose/Rate Route Frequency Ordered Stop   08/13/22 0500  ciprofloxacin (CIPRO) IVPB 400 mg       See Hyperspace for full Linked Orders Report.   400 mg 200 mL/hr   over 60 Minutes Intravenous Every 12 hours 08/12/22 2326 08/20/22 0459   08/13/22 0500   metroNIDAZOLE (FLAGYL) IVPB 500 mg       See Hyperspace for full Linked Orders Report.   500 mg 100 mL/hr over 60 Minutes Intravenous Every 12 hours 08/12/22 2326 08/20/22 0459   08/12/22 1645  ciprofloxacin (CIPRO) IVPB 400 mg       See Hyperspace for full Linked Orders Report.   400 mg 200 mL/hr over 60 Minutes Intravenous  Once 08/12/22 1638 08/12/22 1826   08/12/22 1645  metroNIDAZOLE (FLAGYL) IVPB 500 mg       See Hyperspace for full Linked Orders Report.   500 mg 100 mL/hr over 60 Minutes Intravenous  Once 08/12/22 1638 08/12/22 1826       Assessment/Plan: Appendicitis  Laparoscopic appendectomy today   The procedure has been discussed with the patient.  Alternative therapies have been discussed with the patient.  Operative risks include bleeding,  Infection,  Organ injury,  Nerve injury,  Blood vessel injury,  DVT,  Pulmonary embolism,  Death,  And possible reoperation.  Medical management risks include worsening of present situation.  The success of the procedure is 50 -90 % at treating patients symptoms.  The patient understands and agrees to proceed.    LOS: 1 day    Lenya Sterne A Ricki Vanhandel 08/13/2022 Straightforward 

## 2022-08-13 NOTE — Op Note (Signed)
Appendectomy, Laparoscopic, Procedure Note  Indications: The patient presented with a history of right-sided abdominal pain. A CT scan exam showed acute appendicitis without perforation.  Options of treatment were reviewed to include surgery and medical management.  He opted for laparoscopic appendectomy.The procedure has been discussed with the patient.  Alternative therapies have been discussed with the patient.  Operative risks include bleeding,  Infection,  Organ injury,  Nerve injury,  Blood vessel injury,  DVT,  Pulmonary embolism,  Death,  And possible reoperation.  Medical management risks include worsening of present situation.  The success of the procedure is 50 -90 % at treating patients symptoms.  The patient understands and agrees to proceed.   Pre-operative Diagnosis: Acute appendicitis without mention of peritonitis  Post-operative Diagnosis: Same  Surgeon: Dortha Schwalbe  MD  Assistants: OR staff  Anesthesia: General endotracheal anesthesia and Local anesthesia 0.25.% bupivacaine  ASA Class: 1  Procedure Details  The patient was seen again in the Holding Room. The risks, benefits, complications, treatment options, and expected outcomes were discussed with the patient and/or family. The possibilities of reaction to medication, pulmonary aspiration, perforation of viscus, bleeding, recurrent infection, finding a normal appendix, the need for additional procedures, failure to diagnose a condition, and creating a complication requiring transfusion or operation were discussed. There was concurrence with the proposed plan and informed consent was obtained. The site of surgery was properly noted/marked. The patient was taken to Operating Room, identified as Brendan [name] and the procedure verified as Appendectomy. A Time Out was held and the above information confirmed.  The patient was placed in the supine position and general anesthesia was induced, along with placement of  orogastric tube, Venodyne boots, and a Foley catheter. The abdomen was prepped and draped in a sterile fashion. A one centimeter infraumbilical incision was made and the peritoneal cavity was accessed using the OPEN  technique. The pneumoperitoneum was then established to steady pressure of 12 mmHg. A 12 mm port was placed through the umbilical incision. Additional 5 mm cannulas then placed in the left lower quadrant of the abdomen and right upper quadrant under direct vision. A careful evaluation of the entire abdomen was carried out. The patient was placed in Trendelenburg and left lateral decubitus position. The small intestines were retracted in the cephalad and left lateral direction away from the pelvis and right lower quadrant. The patient was found to have an enlarged and inflamed appendix that was extending into the pelvis. There was no evidence of perforation.  The appendix was carefully dissected. A window was made in the mesoappendix at the base of the appendix. A harmonic scalpel was used across the mesoappendix. The appendix was divided at its base using an endo-GIA stapler. Minimal appendiceal stump was left in place. There was no evidence of bleeding, leakage, or complication after division of the appendix. Irrigation was also performed and irrigate suctioned from the abdomen as well.  The umbilical port site was closed using 0 vicryl pursestring sutures fashion at the level of the fascia. The trocar site skin wounds were closed using 4-0 Monocryl and Dermabond.  Instrument, sponge, and needle counts were correct at the conclusion of the case.   Findings: The appendix was found to be inflamed. There were signs of necrosis.  There was not perforation. There was not abscess formation.  Estimated Blood Loss:  less than 50 mL         Drains: None         Total  IV Fluids: Per OR record          Specimens: Appendix         Complications:  None; patient tolerated the procedure well.          Disposition: PACU - hemodynamically stable.         Condition: stable

## 2022-08-13 NOTE — Plan of Care (Signed)

## 2022-08-13 NOTE — Anesthesia Preprocedure Evaluation (Addendum)
Anesthesia Evaluation  Patient identified by MRN, date of birth, ID band Patient awake    Reviewed: Allergy & Precautions, H&P , NPO status , Patient's Chart, lab work & pertinent test results  Airway Mallampati: I  TM Distance: >3 FB Neck ROM: Full    Dental no notable dental hx. (+) Teeth Intact, Dental Advisory Given   Pulmonary neg pulmonary ROS, asthma , sleep apnea    Pulmonary exam normal breath sounds clear to auscultation       Cardiovascular Exercise Tolerance: Good hypertension, Pt. on medications negative cardio ROS Normal cardiovascular exam Rhythm:Regular Rate:Normal     Neuro/Psych  Headaches  Neuromuscular disease negative neurological ROS  negative psych ROS   GI/Hepatic negative GI ROS, Neg liver ROS,GERD  ,,  Endo/Other  negative endocrine ROS    Renal/GU negative Renal ROS  negative genitourinary   Musculoskeletal negative musculoskeletal ROS (+) Arthritis , Osteoarthritis,    Abdominal   Peds negative pediatric ROS (+)  Hematology negative hematology ROS (+)   Anesthesia Other Findings   Reproductive/Obstetrics negative OB ROS                             Anesthesia Physical Anesthesia Plan  ASA: 2  Anesthesia Plan: General   Post-op Pain Management: Minimal or no pain anticipated, Toradol IV (intra-op)* and Ofirmev IV (intra-op)*   Induction: Intravenous  PONV Risk Score and Plan: 2 and Ondansetron, Dexamethasone and Treatment may vary due to age or medical condition  Airway Management Planned: Oral ETT  Additional Equipment:   Intra-op Plan:   Post-operative Plan: Extubation in OR  Informed Consent: I have reviewed the patients History and Physical, chart, labs and discussed the procedure including the risks, benefits and alternatives for the proposed anesthesia with the patient or authorized representative who has indicated his/her understanding and  acceptance.       Plan Discussed with: Anesthesiologist and CRNA  Anesthesia Plan Comments: (  )       Anesthesia Quick Evaluation

## 2022-08-14 ENCOUNTER — Encounter (HOSPITAL_COMMUNITY): Payer: Self-pay | Admitting: Surgery

## 2022-08-14 ENCOUNTER — Encounter (HOSPITAL_COMMUNITY): Payer: Self-pay

## 2022-08-14 LAB — SURGICAL PATHOLOGY

## 2022-08-14 MED ORDER — OXYCODONE HCL 5 MG PO TABS
5.0000 mg | ORAL_TABLET | Freq: Four times a day (QID) | ORAL | 0 refills | Status: AC | PRN
Start: 2022-08-14 — End: ?

## 2022-08-14 NOTE — Discharge Summary (Signed)
Physician Discharge Summary  Patient ID: Brendan [name] MRN: [id number] DOB/AGE: November 20, 1975 47 y.o.  Admit date: 08/12/2022 Discharge date: 08/14/2022  Admission Diagnoses: Acute appendicitis  Discharge Diagnoses:  Principal Problem:   Acute appendicitis Active Problems:   Appendicitis   Discharged Condition: good  Hospital Course: Patient went laparoscopic appendectomy without any immediate complications.  Postop day 1 he is tolerating his diet, had good pain control, and his incisions are clean dry intact.  He was discharged home.     Treatments: surgery: Laparoscopic appendectomy  Discharge Exam: Blood pressure 117/62, pulse (!) 51, temperature 98 F (36.7 C), temperature source Oral, resp. rate 17, height 6' (1.829 m), weight 117.9 kg, SpO2 96%. General appearance: alert and cooperative Resp: clear to auscultation bilaterally Cardio: Normal sinus rhythm Incision/Wound: Port sites clean dry intact.  Abdomen sore nondistended no peritonitis  Disposition: Discharge disposition: 01-Home or Self Care       Discharge Instructions     Diet - low sodium heart healthy   Complete by: As directed    Increase activity slowly   Complete by: As directed       Allergies as of 08/14/2022       Reactions   Penicillins Anaphylaxis   Aspirin    Nose bleeds as a child   Cyclobenzaprine    Other reaction(s): Rash/Swellling   Latex Hives, Itching        Medication List     TAKE these medications    albuterol 108 (90 Base) MCG/ACT inhaler Commonly known as: VENTOLIN HFA Inhale 2 puffs into the lungs every 6 (six) hours as needed.   betamethasone dipropionate 0.05 % cream Apply 1 Application topically 2 (two) times daily as needed.   gabapentin 300 MG capsule Commonly known as: NEURONTIN Take 300 mg by mouth 2 (two) times daily.   meloxicam 15 MG tablet Commonly known as: MOBIC Take 15 mg by mouth daily.   naproxen sodium 220 MG tablet Commonly known as:  ALEVE Take 220 mg by mouth.   oxyCODONE 5 MG immediate release tablet Commonly known as: Oxy IR/ROXICODONE Take 1 tablet (5 mg total) by mouth every 6 (six) hours as needed for severe pain.   traZODone 100 MG tablet Commonly known as: DESYREL Take 100 mg by mouth at bedtime.   valsartan-hydrochlorothiazide 160-25 MG tablet Commonly known as: DIOVAN-HCT Take 1 tablet by mouth daily.        Follow-up Information     Maczis, Hedda Slade, PA-C Follow up.   Specialty: General Surgery Why: our office is scheduling you for post-operative follow up in about 3 weeks. call to confirm appointment date/time. Contact information: [address] [place] [address] [phone]                 Signed: Clovis Pu Aletta Edmunds MD 08/14/2022, 9:00 AM

## 2022-08-14 NOTE — Plan of Care (Signed)

## 2022-08-14 NOTE — Plan of Care (Signed)
  Problem: Education: Goal: Knowledge of General Education information will improve Description: Including pain rating scale, medication(s)/side effects and non-pharmacologic comfort measures 08/14/2022 0508 by Damita Lack, RN Outcome: Progressing 08/13/2022 2229 by Damita Lack, RN Outcome: Progressing   Problem: Pain Managment: Goal: General experience of comfort will improve 08/14/2022 0508 by Damita Lack, RN Outcome: Progressing 08/13/2022 2229 by Damita Lack, RN Outcome: Progressing   Problem: Skin Integrity: Goal: Risk for impaired skin integrity will decrease 08/14/2022 0508 by Damita Lack, RN Outcome: Progressing 08/13/2022 2229 by Damita Lack, RN Outcome: Progressing

## 2022-08-14 NOTE — Progress Notes (Signed)
Patient being home, self-care. PIV removed, catheter intact. AVS discussed, pt verbalized understanding. Work note provided to patient as requested. Patient escorted off the unit by wife and volunteers in NAD.

## 2022-08-14 NOTE — Anesthesia Postprocedure Evaluation (Signed)
Anesthesia Post Note  Patient: Gleb Mcguire  Procedure(s) Performed: APPENDECTOMY LAPAROSCOPIC (Abdomen)     Patient location during evaluation: PACU Anesthesia Type: General Level of consciousness: awake and alert Pain management: pain level controlled Vital Signs Assessment: post-procedure vital signs reviewed and stable Respiratory status: spontaneous breathing, nonlabored ventilation, respiratory function stable and patient connected to nasal cannula oxygen Cardiovascular status: blood pressure returned to baseline and stable Postop Assessment: no apparent nausea or vomiting Anesthetic complications: no   No notable events documented.  Last Vitals:  Vitals:   08/14/22 0448 08/14/22 0825  BP: (!) 101/57 117/62  Pulse: (!) 55 (!) 51  Resp: 18 17  Temp: 36.8 C 36.7 C  SpO2: 97% 96%    Last Pain:  Vitals:   08/14/22 0825  TempSrc: Oral  PainSc:    Pain Goal: Patients Stated Pain Goal: 3 (08/13/22 2057)                 Jashaun Penrose
